# Patient Record
Sex: Female | Born: 1975 | Race: Black or African American | Hispanic: No | Marital: Single | Smoking: Never smoker
Health system: Southern US, Community
[De-identification: ages and names within clinical notes are randomized; demographics above are authoritative.]

## PROBLEM LIST (undated history)

## (undated) DIAGNOSIS — L680 Hirsutism: Secondary | ICD-10-CM

## (undated) HISTORY — DX: Hirsutism: L68.0

---

## 1997-07-31 ENCOUNTER — Emergency Department (HOSPITAL_COMMUNITY): Admission: EM | Admit: 1997-07-31 | Discharge: 1997-07-31 | Payer: Self-pay | Admitting: Emergency Medicine

## 1998-12-23 ENCOUNTER — Emergency Department (HOSPITAL_COMMUNITY): Admission: EM | Admit: 1998-12-23 | Discharge: 1998-12-23 | Payer: Self-pay | Admitting: Emergency Medicine

## 2000-04-24 HISTORY — PX: TUBAL LIGATION: SHX77

## 2000-05-18 ENCOUNTER — Ambulatory Visit (HOSPITAL_COMMUNITY): Admission: RE | Admit: 2000-05-18 | Discharge: 2000-05-18 | Payer: Self-pay | Admitting: Neurology

## 2000-05-18 ENCOUNTER — Encounter: Admission: RE | Admit: 2000-05-18 | Discharge: 2000-05-18 | Payer: Self-pay | Admitting: Family Medicine

## 2000-05-28 ENCOUNTER — Encounter: Admission: RE | Admit: 2000-05-28 | Discharge: 2000-05-28 | Payer: Self-pay | Admitting: Family Medicine

## 2000-06-11 ENCOUNTER — Encounter: Admission: RE | Admit: 2000-06-11 | Discharge: 2000-06-11 | Payer: Self-pay | Admitting: Family Medicine

## 2000-07-12 ENCOUNTER — Encounter: Admission: RE | Admit: 2000-07-12 | Discharge: 2000-07-12 | Payer: Self-pay | Admitting: Family Medicine

## 2000-08-13 ENCOUNTER — Encounter: Admission: RE | Admit: 2000-08-13 | Discharge: 2000-08-13 | Payer: Self-pay | Admitting: Family Medicine

## 2000-08-13 ENCOUNTER — Ambulatory Visit (HOSPITAL_COMMUNITY): Admission: RE | Admit: 2000-08-13 | Discharge: 2000-08-13 | Payer: Self-pay | Admitting: Family Medicine

## 2000-09-12 ENCOUNTER — Encounter: Admission: RE | Admit: 2000-09-12 | Discharge: 2000-09-12 | Payer: Self-pay | Admitting: Family Medicine

## 2000-10-19 ENCOUNTER — Encounter: Admission: RE | Admit: 2000-10-19 | Discharge: 2000-10-19 | Payer: Self-pay | Admitting: Family Medicine

## 2000-11-01 ENCOUNTER — Encounter: Admission: RE | Admit: 2000-11-01 | Discharge: 2000-11-01 | Payer: Self-pay | Admitting: Family Medicine

## 2000-11-15 ENCOUNTER — Encounter: Admission: RE | Admit: 2000-11-15 | Discharge: 2000-11-15 | Payer: Self-pay | Admitting: Family Medicine

## 2000-11-29 ENCOUNTER — Encounter: Admission: RE | Admit: 2000-11-29 | Discharge: 2000-11-29 | Payer: Self-pay | Admitting: Family Medicine

## 2000-12-13 ENCOUNTER — Encounter: Admission: RE | Admit: 2000-12-13 | Discharge: 2000-12-13 | Payer: Self-pay | Admitting: Family Medicine

## 2000-12-20 ENCOUNTER — Encounter: Admission: RE | Admit: 2000-12-20 | Discharge: 2000-12-20 | Payer: Self-pay | Admitting: Sports Medicine

## 2000-12-28 ENCOUNTER — Encounter: Admission: RE | Admit: 2000-12-28 | Discharge: 2000-12-28 | Payer: Self-pay | Admitting: Family Medicine

## 2001-01-02 ENCOUNTER — Encounter (INDEPENDENT_AMBULATORY_CARE_PROVIDER_SITE_OTHER): Payer: Self-pay | Admitting: *Deleted

## 2001-01-02 ENCOUNTER — Inpatient Hospital Stay (HOSPITAL_COMMUNITY): Admission: AD | Admit: 2001-01-02 | Discharge: 2001-01-04 | Payer: Self-pay | Admitting: *Deleted

## 2001-02-19 ENCOUNTER — Encounter: Admission: RE | Admit: 2001-02-19 | Discharge: 2001-02-19 | Payer: Self-pay | Admitting: Family Medicine

## 2001-08-23 ENCOUNTER — Encounter: Admission: RE | Admit: 2001-08-23 | Discharge: 2001-08-23 | Payer: Self-pay | Admitting: Family Medicine

## 2001-08-24 ENCOUNTER — Encounter (INDEPENDENT_AMBULATORY_CARE_PROVIDER_SITE_OTHER): Payer: Self-pay | Admitting: *Deleted

## 2001-08-24 LAB — CONVERTED CEMR LAB

## 2001-08-28 ENCOUNTER — Encounter: Admission: RE | Admit: 2001-08-28 | Discharge: 2001-08-28 | Payer: Self-pay | Admitting: Family Medicine

## 2002-01-12 ENCOUNTER — Emergency Department (HOSPITAL_COMMUNITY): Admission: EM | Admit: 2002-01-12 | Discharge: 2002-01-13 | Payer: Self-pay | Admitting: Emergency Medicine

## 2002-01-12 ENCOUNTER — Encounter: Payer: Self-pay | Admitting: Emergency Medicine

## 2002-02-28 ENCOUNTER — Encounter: Admission: RE | Admit: 2002-02-28 | Discharge: 2002-02-28 | Payer: Self-pay | Admitting: Family Medicine

## 2003-11-03 ENCOUNTER — Emergency Department (HOSPITAL_COMMUNITY): Admission: EM | Admit: 2003-11-03 | Discharge: 2003-11-03 | Payer: Self-pay | Admitting: Family Medicine

## 2003-11-13 ENCOUNTER — Encounter: Admission: RE | Admit: 2003-11-13 | Discharge: 2003-11-13 | Payer: Self-pay | Admitting: Family Medicine

## 2004-09-26 ENCOUNTER — Emergency Department (HOSPITAL_COMMUNITY): Admission: EM | Admit: 2004-09-26 | Discharge: 2004-09-27 | Payer: Self-pay

## 2006-01-17 ENCOUNTER — Ambulatory Visit: Payer: Self-pay | Admitting: Family Medicine

## 2006-01-18 ENCOUNTER — Encounter: Admission: RE | Admit: 2006-01-18 | Discharge: 2006-01-18 | Payer: Self-pay | Admitting: Family Medicine

## 2006-06-06 ENCOUNTER — Ambulatory Visit: Payer: Self-pay | Admitting: Sports Medicine

## 2006-06-21 DIAGNOSIS — N926 Irregular menstruation, unspecified: Secondary | ICD-10-CM | POA: Insufficient documentation

## 2006-06-22 ENCOUNTER — Encounter (INDEPENDENT_AMBULATORY_CARE_PROVIDER_SITE_OTHER): Payer: Self-pay | Admitting: *Deleted

## 2008-03-26 ENCOUNTER — Ambulatory Visit: Payer: Self-pay | Admitting: Family Medicine

## 2008-03-26 DIAGNOSIS — S61209A Unspecified open wound of unspecified finger without damage to nail, initial encounter: Secondary | ICD-10-CM | POA: Insufficient documentation

## 2008-05-06 ENCOUNTER — Encounter: Payer: Self-pay | Admitting: Family Medicine

## 2008-08-24 ENCOUNTER — Encounter: Payer: Self-pay | Admitting: Family Medicine

## 2008-08-24 ENCOUNTER — Encounter (INDEPENDENT_AMBULATORY_CARE_PROVIDER_SITE_OTHER): Payer: Self-pay | Admitting: Family Medicine

## 2008-08-24 ENCOUNTER — Ambulatory Visit: Payer: Self-pay | Admitting: Family Medicine

## 2008-08-24 DIAGNOSIS — S139XXA Sprain of joints and ligaments of unspecified parts of neck, initial encounter: Secondary | ICD-10-CM

## 2009-09-27 ENCOUNTER — Emergency Department (HOSPITAL_COMMUNITY): Admission: EM | Admit: 2009-09-27 | Discharge: 2009-09-27 | Payer: Self-pay | Admitting: Emergency Medicine

## 2010-06-01 ENCOUNTER — Other Ambulatory Visit: Payer: Self-pay | Admitting: Family Medicine

## 2010-06-01 ENCOUNTER — Encounter: Payer: Self-pay | Admitting: Family Medicine

## 2010-06-01 ENCOUNTER — Ambulatory Visit (INDEPENDENT_AMBULATORY_CARE_PROVIDER_SITE_OTHER): Payer: 59 | Admitting: Family Medicine

## 2010-06-01 VITALS — BP 135/84 | HR 77 | Temp 98.7°F | Ht 67.5 in | Wt 196.7 lb

## 2010-06-01 DIAGNOSIS — Z0000 Encounter for general adult medical examination without abnormal findings: Secondary | ICD-10-CM

## 2010-06-01 DIAGNOSIS — Z124 Encounter for screening for malignant neoplasm of cervix: Secondary | ICD-10-CM | POA: Insufficient documentation

## 2010-06-01 DIAGNOSIS — N926 Irregular menstruation, unspecified: Secondary | ICD-10-CM

## 2010-06-01 DIAGNOSIS — Z202 Contact with and (suspected) exposure to infections with a predominantly sexual mode of transmission: Secondary | ICD-10-CM | POA: Insufficient documentation

## 2010-06-01 DIAGNOSIS — Z20828 Contact with and (suspected) exposure to other viral communicable diseases: Secondary | ICD-10-CM

## 2010-06-01 LAB — POCT WET PREP (WET MOUNT): Yeast Wet Prep HPF POC: NEGATIVE

## 2010-06-01 LAB — RPR

## 2010-06-01 NOTE — Patient Instructions (Addendum)
It was good to see you today. You need to start getting mammograms this year since your mom developed breat ca in her 40's.  We will call you with a date and time for your ultrasound and with your labs results.

## 2010-06-01 NOTE — Assessment & Plan Note (Signed)
Pt is concerned that she may have gotten an STI from her separated husband. Will check for STI's

## 2010-06-01 NOTE — Progress Notes (Signed)
  Subjective:    Patient ID: Renee Lutz, female    DOB: [birth year], 35 y.o.   MRN: [id number]  HPI Pt here for a PAP smear and breast exam.  Her mother developed Breast Ca in her "84's" and she is now aware that she needs to get mammograms. Pt is going through a divorce currently and thinks that her husband may have cheated on her. She wants to be tested for STI's before she starts dating again.   Pt has irregular menstration: She has a h/o irregular painful menstration that use to last 9 days until just recently where it has shortened to about 5 days. She has had a BTL and it not taking any birth control. She also complains of facial hair that started after the birth of her last child. She is considering laser hair removal. She is overweight.    Review of Systems  Constitutional: Negative.   HENT: Negative.   Eyes: Negative.   Genitourinary: Positive for vaginal bleeding and menstrual problem.  Psychiatric/Behavioral: Negative.        Objective:   Physical Exam  Constitutional: She appears well-developed and well-nourished.  HENT:  Head: Normocephalic and atraumatic.  Neck: Normal range of motion.  Pulmonary/Chest:       Normal breast exam bilaterally. No LAD.   Abdominal: Soft.  Genitourinary: Vagina normal and uterus normal. No vaginal discharge found.          Assessment & Plan:

## 2010-06-01 NOTE — Assessment & Plan Note (Signed)
Irregular menstration, hirsutism and overweight makes me think this patient may have PCOS. Will get an Ultrasound to confirm my suspicion. If + will start the patient on metformin.

## 2010-06-01 NOTE — Assessment & Plan Note (Signed)
Pt is concerned that she may have gotten an STI from her separated husband. Will check for STI's 

## 2010-06-01 NOTE — Assessment & Plan Note (Signed)
Pap smear done today. Breast exam done as well. Pt given info about setting up a screening exam. She has the phone number and will call.

## 2010-06-02 LAB — GC/CHLAMYDIA PROBE AMP, GENITAL
Chlamydia, DNA Probe: NEGATIVE
GC Probe Amp, Genital: NEGATIVE

## 2010-06-06 ENCOUNTER — Other Ambulatory Visit: Payer: Self-pay | Admitting: Family Medicine

## 2010-06-08 ENCOUNTER — Encounter: Payer: Self-pay | Admitting: Family Medicine

## 2010-06-08 ENCOUNTER — Other Ambulatory Visit: Payer: Self-pay | Admitting: Family Medicine

## 2010-06-08 DIAGNOSIS — B9689 Other specified bacterial agents as the cause of diseases classified elsewhere: Secondary | ICD-10-CM | POA: Insufficient documentation

## 2010-06-08 DIAGNOSIS — N760 Acute vaginitis: Secondary | ICD-10-CM

## 2010-06-08 MED ORDER — METRONIDAZOLE 500 MG PO TABS: 500.0000 mg | ORAL_TABLET | Freq: Two times a day (BID) | ORAL | Status: AC

## 2010-07-01 ENCOUNTER — Telehealth: Payer: Self-pay | Admitting: *Deleted

## 2010-07-01 NOTE — Telephone Encounter (Signed)
Attempted to call patient to inform that pap smear was normal. Message says that mailbox is full.

## 2010-09-09 NOTE — Op Note (Signed)
[facility]  Patient:    Renee Lutz, Renee Lutz Visit Number: [id number] MRN: [id number]          Service Type: OBS Location: 910A [id number] Attending Physician:  Michaelle Copas Dictated by:   Roseanna Rainbow, M.D. Proc. Date: 01/03/01 Admit Date:  01/02/2001                             Operative Report  PREOPERATIVE DIAGNOSES:       Multiparity, desire sterilization.  POSTOPERATIVE DIAGNOSES:      Multiparity, desire sterilization.  PROCEDURE:                    Postpartum tubal ligation, modified [place].  SURGEON:                      Roseanna Rainbow, M.D.  ANESTHESIA:                   General endotracheal.  COMPLICATIONS:                None.  ESTIMATED BLOOD LOSS:         Less than 20 cc.  FLUIDS:                       ______  INDICATIONS FOR PROCEDURE:    The patient is a 35 year old para 2 status post NSVD who desires permanent sterilization. The risks and benefits of the procedure were reviewed with the patient including but not limited to risk of failure 4 to 11/998 with an increased risk of an ectopic gestation if pregnancy occurs.  FINDINGS:                     Normal uterus, tubes and ovaries.  DESCRIPTION OF PROCEDURE:     The patient was taken to the operating room where general anesthesia was induced without difficulty. A small transverse infraumbilical skin incision was made with the scalpel. The incision was carried down through the underlying fascia. The parietoperitoneum was identified and entered. The peritoneum was noted to be free of any adhesions and the incision was extended with the Metzenbaum scissors. The patients left fallopian tube was then identified, brought to the incision and grasped with the Babcock clamp. The tube was then followed out to the fimbriated end. The Babcock clamp was used to grasp the tube approximately 4 cm from the cornual region. A 2-3 cm segment of tube was then doubly  ligated with three ties of plain gut and then tied. Excellent hemostasis was noted and the tube returned to the abdomen. The right fallopian tube was manipulated in a similar fashion. The peritoneum and fascia were closed in a single layer using #0 Vicryl. The skin was closed in a subcuticular fashion using 3-0 Vicryl. The patient tolerated the procedure well. Sponge, lap, and needle counts were correct x 2. The patient was taken to the PACU in stable condition.  PATHOLOGY:                    Segments of right and left fallopian tubes. Dictated by:   Roseanna Rainbow, M.D. Attending Physician:  Michaelle Copas DD:  01/03/01 TD:  01/03/01 Job: [id number]

## 2011-12-26 ENCOUNTER — Encounter (HOSPITAL_COMMUNITY): Payer: Self-pay | Admitting: *Deleted

## 2011-12-26 ENCOUNTER — Emergency Department (HOSPITAL_COMMUNITY)
Admission: EM | Admit: 2011-12-26 | Discharge: 2011-12-26 | Disposition: A | Discharge status: Home / Self Care | Payer: 59 | Attending: Emergency Medicine | Admitting: Emergency Medicine

## 2011-12-26 DIAGNOSIS — Z803 Family history of malignant neoplasm of breast: Secondary | ICD-10-CM | POA: Insufficient documentation

## 2011-12-26 DIAGNOSIS — Z8249 Family history of ischemic heart disease and other diseases of the circulatory system: Secondary | ICD-10-CM | POA: Insufficient documentation

## 2011-12-26 DIAGNOSIS — W268XXA Contact with other sharp object(s), not elsewhere classified, initial encounter: Secondary | ICD-10-CM | POA: Insufficient documentation

## 2011-12-26 DIAGNOSIS — Z8489 Family history of other specified conditions: Secondary | ICD-10-CM | POA: Insufficient documentation

## 2011-12-26 DIAGNOSIS — S91012A Laceration without foreign body, left ankle, initial encounter: Secondary | ICD-10-CM

## 2011-12-26 DIAGNOSIS — Z23 Encounter for immunization: Secondary | ICD-10-CM | POA: Insufficient documentation

## 2011-12-26 DIAGNOSIS — S81009A Unspecified open wound, unspecified knee, initial encounter: Secondary | ICD-10-CM | POA: Insufficient documentation

## 2011-12-26 DIAGNOSIS — Z833 Family history of diabetes mellitus: Secondary | ICD-10-CM | POA: Insufficient documentation

## 2011-12-26 MED ORDER — BACITRACIN ZINC 500 UNIT/GM EX OINT
TOPICAL_OINTMENT | Freq: Two times a day (BID) | CUTANEOUS | Status: DC
  Filled 2011-12-26: qty 15

## 2011-12-26 MED ORDER — CEPHALEXIN 500 MG PO CAPS: 500.0000 mg | ORAL_CAPSULE | Freq: Four times a day (QID) | ORAL | Status: AC

## 2011-12-26 MED ORDER — BACITRACIN 500 UNIT/GM EX OINT
1.0000 "application " | TOPICAL_OINTMENT | Freq: Once | CUTANEOUS | Status: AC
  Administered 2011-12-26: 1 via TOPICAL
  Filled 2011-12-26: qty 0.9

## 2011-12-26 MED ORDER — TETANUS-DIPHTH-ACELL PERTUSSIS 5-2.5-18.5 LF-MCG/0.5 IM SUSP
0.5000 mL | Freq: Once | INTRAMUSCULAR | Status: AC
  Administered 2011-12-26: 0.5 mL via INTRAMUSCULAR
  Filled 2011-12-26: qty 0.5

## 2011-12-26 NOTE — ED Provider Notes (Signed)
History    This chart was scribed for Laray Anger, DO, MD by Smitty Pluck. The patient was seen in room TR07C and the patient's care was started at 2:18PM.   CSN: [id number]  Arrival date & time 12/26/11  1241   First MD Initiated Contact with Patient 12/26/11 1418      Chief Complaint  Patient presents with  . Extremity Laceration     HPI Renee Lutz is a 36 y.o. female who presents to the Emergency Department complaining of sudden onset and persistence of constant left foot laceration onset at 7:00PM, 1 day ago. Pt reports that she cut her foot on a piece of glass that fell while she was packing. Pt has washed the wound with peroxide.  Denies focal motor weakness, no tingling/numbness in extremity.  Pt is unsure if tetanus is UTD. Pt is requesting a work note.     Past Medical History  Diagnosis Date  . Hirsutism     Past Surgical History  Procedure Date  . Tubal ligation 2002    Family History  Problem Relation Age of Onset  . Breast cancer Mother   . Diabetes Mother   . Hypertension Mother   . Fibroids Mother   . Hypertension Father   . Glaucoma Father     History  Substance Use Topics  . Smoking status: Never Smoker   . Smokeless tobacco: Not on file  . Alcohol Use: No    Review of Systems ROS: Statement: All systems negative except as marked or noted in the HPI; Constitutional: Negative for fever and chills. ; ; Eyes: Negative for eye pain, redness and discharge. ; ; ENMT: Negative for ear pain, hoarseness, nasal congestion, sinus pressure and sore throat. ; ; Cardiovascular: Negative for chest pain, palpitations, diaphoresis, dyspnea and peripheral edema. ; ; Respiratory: Negative for cough, wheezing and stridor. ; ; Gastrointestinal: Negative for nausea, vomiting, diarrhea, abdominal pain, blood in stool, hematemesis, jaundice and rectal bleeding. . ; ; Genitourinary: Negative for dysuria, flank pain and hematuria. ; ; Musculoskeletal: Negative for back  pain and neck pain. Negative for swelling and trauma.; ; Skin: +laceration. Negative for pruritus, rash, abrasions, blisters, bruising and skin lesion.; ; Neuro: Negative for headache, lightheadedness and neck stiffness. Negative for weakness, altered level of consciousness , altered mental status, extremity weakness, paresthesias, involuntary movement, seizure and syncope.       Allergies  Review of patient's allergies indicates no known allergies.  Home Medications  No current outpatient prescriptions on file.  BP 125/62  Pulse 77  Temp 98.6 F (37 C)  Resp 16  SpO2 99%  Physical Exam 1425: Physical examination:  Nursing notes reviewed; Vital signs and O2 SAT reviewed;  Constitutional: Well developed, Well nourished, Well hydrated, In no acute distress; Head:  Normocephalic, atraumatic; Eyes: EOMI, PERRL, No scleral icterus; ENMT: Mouth and pharynx normal, Mucous membranes moist; Neck: Supple, Full range of motion, No lymphadenopathy; Cardiovascular: Regular rate and rhythm, No murmur, rub, or gallop; Respiratory: Breath sounds clear & equal bilaterally, No rales, rhonchi, wheezes.  Speaking full sentences with ease, Normal respiratory effort/excursion; Chest: Nontender, Movement normal;; Extremities: Pulses normal, No deformity. No tenderness, No edema, No calf edema or asymmetry.; Neuro: AA&Ox3, Major CN grossly intact.  Speech clear. No gross focal motor or sensory deficits in extremities.; Skin: Color normal, Warm, Dry, +2cm hemostatic superficial vertical linear skin lac to left lateral posterior malleolus without drainage or ecchymosis, no surrounding erythema.  Wound explored with adequate  hemostasis through ROM, as well as in position at the time of injury.  No apparent gross retained foreign body, no significant involvement of deep structures such as bone/joint/tendon/or neurovascular involvement noted.   ED Course  Procedures    MDM  MDM Reviewed: nursing note and  vitals     1430:  Lac well over 19+ hours old, will not close.  Wound care provided.  Td updated.  Requests a note for work.  Dx d/w pt.  Questions answered.  Verb understanding, agreeable to d/c home with outpt f/u.      I personally performed the services described in this documentation, which was scribed in my presence. The recorded information has been reviewed and considered. Sircharles Holzheimer Allison Quarry, DO 12/28/11 1556

## 2011-12-26 NOTE — ED Notes (Signed)
Lac to lower lankle caused by glass done yesterday  At 7 pm ~ 2 inch lac bleeding controlled at this time some swelling noted

## 2013-08-17 ENCOUNTER — Encounter (HOSPITAL_COMMUNITY): Payer: Self-pay | Admitting: Emergency Medicine

## 2013-08-17 ENCOUNTER — Emergency Department (HOSPITAL_COMMUNITY): Payer: 59

## 2013-08-17 ENCOUNTER — Emergency Department (HOSPITAL_COMMUNITY)
Admission: EM | Admit: 2013-08-17 | Discharge: 2013-08-17 | Disposition: A | Discharge status: Home / Self Care | Payer: 59 | Attending: Emergency Medicine | Admitting: Emergency Medicine

## 2013-08-17 DIAGNOSIS — Z79899 Other long term (current) drug therapy: Secondary | ICD-10-CM | POA: Insufficient documentation

## 2013-08-17 DIAGNOSIS — R059 Cough, unspecified: Secondary | ICD-10-CM

## 2013-08-17 DIAGNOSIS — R05 Cough: Secondary | ICD-10-CM

## 2013-08-17 DIAGNOSIS — Z872 Personal history of diseases of the skin and subcutaneous tissue: Secondary | ICD-10-CM | POA: Insufficient documentation

## 2013-08-17 DIAGNOSIS — J069 Acute upper respiratory infection, unspecified: Secondary | ICD-10-CM | POA: Insufficient documentation

## 2013-08-17 MED ORDER — FLUTICASONE PROPIONATE 50 MCG/ACT NA SUSP
2.0000 | Freq: Every day | NASAL | Status: DC
  Administered 2013-08-17: 2 via NASAL
  Filled 2013-08-17: qty 16

## 2013-08-17 MED ORDER — BENZONATATE 200 MG PO CAPS: 200.0000 mg | ORAL_CAPSULE | Freq: Three times a day (TID) | ORAL | Status: DC | PRN

## 2013-08-17 MED ORDER — HYDROCOD POLST-CHLORPHEN POLST 10-8 MG/5ML PO LQCR: 5.0000 mL | Freq: Two times a day (BID) | ORAL | Status: DC | PRN

## 2013-08-17 MED ORDER — FLUTICASONE PROPIONATE 50 MCG/ACT NA SUSP: 2.0000 | Freq: Every day | NASAL | Status: DC

## 2013-08-17 MED ORDER — ALBUTEROL SULFATE HFA 108 (90 BASE) MCG/ACT IN AERS: 1.0000 | INHALATION_SPRAY | RESPIRATORY_TRACT | Status: DC | PRN

## 2013-08-17 MED ORDER — BENZONATATE 100 MG PO CAPS
200.0000 mg | ORAL_CAPSULE | Freq: Three times a day (TID) | ORAL | Status: DC | PRN
  Administered 2013-08-17: 200 mg via ORAL
  Filled 2013-08-17: qty 2

## 2013-08-17 MED ORDER — ALBUTEROL SULFATE HFA 108 (90 BASE) MCG/ACT IN AERS
1.0000 | INHALATION_SPRAY | RESPIRATORY_TRACT | Status: DC | PRN
  Administered 2013-08-17: 2 via RESPIRATORY_TRACT
  Filled 2013-08-17: qty 6.7

## 2013-08-17 NOTE — ED Notes (Signed)
Pt reports nasal congestion, coughing up thick white mucus, and intermittent chills x 2 wks; sx unrelieved by Robitussin and Mussinex. Pt also c/o generalized body aches. Pt unsure if she has been running a fever.

## 2013-08-17 NOTE — ED Provider Notes (Signed)
CSN: [id number]     Arrival date & time 08/17/13  0022 History   First MD Initiated Contact with Patient 08/17/13 [phone]     Chief Complaint  Patient presents with  . Nasal Congestion  . Cough     (Consider location/radiation/quality/duration/timing/severity/associated sxs/prior Treatment) HPI 38 year old female presents to emergency room with complaint of persistent cough, nasal congestion for the past 2 weeks.  Patient reports about a week ago she was also having myalgias, chills and hot flashes.  She's been taken Robitussin, Mucinex, Excedrin without improvement.  She also has been taking Claritin taking symptoms may be associated with allergy.  This is also not helped.  Patient is nonsmoker.  No past medical history.  No sick contacts.  Patient is concerned that she has walking pneumonia.  Patient has subjective fever about 5 days ago none since.  No nausea vomiting diarrhea.  No history of pneumonia, bronchitis. Past Medical History  Diagnosis Date  . Hirsutism    Past Surgical History  Procedure Laterality Date  . Tubal ligation  2002   Family History  Problem Relation Age of Onset  . Breast cancer Mother   . Diabetes Mother   . Hypertension Mother   . Fibroids Mother   . Hypertension Father   . Glaucoma Father    History  Substance Use Topics  . Smoking status: Never Smoker   . Smokeless tobacco: Not on file  . Alcohol Use: No   OB History   Grav Para Term Preterm Abortions TAB SAB Ect Mult Living                 Review of Systems   See History of Present Illness; otherwise all other systems are reviewed and negative  Allergies  Review of patient's allergies indicates no known allergies.  Home Medications   Prior to Admission medications   Medication Sig Start Date End Date Taking? Authorizing Provider  guaiFENesin (MUCINEX) 600 MG 12 hr tablet Take 600 mg by mouth 2 (two) times daily.   Yes Historical Provider, MD  guaifenesin (ROBITUSSIN) 100 MG/5ML syrup  Take 100 mg by mouth 3 (three) times daily as needed for cough.   Yes Historical Provider, MD   BP 125/105  Pulse 68  Temp(Src) 98.6 F (37 C) (Oral)  Resp 19  SpO2 96%  LMP 07/19/2013 Physical Exam  Nursing note and vitals reviewed. Constitutional: She is oriented to person, place, and time. She appears well-developed and well-nourished.  HENT:  Head: Normocephalic and atraumatic.  Right Ear: External ear normal.  Left Ear: External ear normal.  Nose: Nose normal.  Mouth/Throat: Oropharynx is clear and moist.  Eyes: Conjunctivae and EOM are normal. Pupils are equal, round, and reactive to light.  Neck: Normal range of motion. Neck supple. No JVD present. No tracheal deviation present. No thyromegaly present.  Cardiovascular: Normal rate, regular rhythm, normal heart sounds and intact distal pulses.  Exam reveals no gallop and no friction rub.   No murmur heard. Pulmonary/Chest: Effort normal and breath sounds normal. No stridor. No respiratory distress. She has no wheezes. She has no rales. She exhibits no tenderness.  Dry cough noted  Abdominal: Soft. Bowel sounds are normal. She exhibits no distension and no mass. There is no tenderness. There is no rebound and no guarding.  Musculoskeletal: Normal range of motion. She exhibits no edema and no tenderness.  Lymphadenopathy:    She has no cervical adenopathy.  Neurological: She is oriented to person, place, and time.  She has normal reflexes. No cranial nerve deficit. She exhibits normal muscle tone. Coordination normal.  Skin: Skin is warm and dry. No rash noted. No erythema. No pallor.  Psychiatric: She has a normal mood and affect. Her behavior is normal. Judgment and thought content normal.    ED Course  Procedures (including critical care time) Labs Review Labs Reviewed - No data to display  Imaging Review Dg Chest 2 View  08/17/2013   CLINICAL DATA:  Shortness of breath, chest pain, pain and tingling down both arms for 2  weeks  EXAM: CHEST  2 VIEW  COMPARISON:  01/18/2006  FINDINGS: Normal heart size, mediastinal contours, and pulmonary vascularity.  Lungs clear.  No pleural effusion or pneumothorax.  Question mild pectus excavatum.  IMPRESSION: No acute abnormalities.   Electronically Signed   By: Ulyses SouthwardMark  Boles M.D.   On: 08/17/2013 01:21     EKG Interpretation None      MDM   Final diagnoses:  Cough  URI (upper respiratory infection)    38 year old female with nasal congestion cough ongoing for 2 weeks.  We'll check chest x-ray given patient's persistent symptoms.  Will treat with albuterol inhaler, Tessalon, Flonase.    Olivia Mackielga M Meral Geissinger, MD 08/17/13 [phone]

## 2013-08-17 NOTE — Discharge Instructions (Signed)
Cough, Adult  A cough is a reflex that helps clear your throat and airways. It can help heal the body or may be a reaction to an irritated airway. A cough may only last 2 or 3 weeks (acute) or may last more than 8 weeks (chronic).  CAUSES Acute cough:  Viral or bacterial infections. Chronic cough:  Infections.  Allergies.  Asthma.  Post-nasal drip.  Smoking.  Heartburn or acid reflux.  Some medicines.  Chronic lung problems (COPD).  Cancer. SYMPTOMS   Cough.  Fever.  Chest pain.  Increased breathing rate.  High-pitched whistling sound when breathing (wheezing).  Colored mucus that you cough up (sputum). TREATMENT   A bacterial cough may be treated with antibiotic medicine.  A viral cough must run its course and will not respond to antibiotics.  Your caregiver may recommend other treatments if you have a chronic cough. HOME CARE INSTRUCTIONS   Only take over-the-counter or prescription medicines for pain, discomfort, or fever as directed by your caregiver. Use cough suppressants only as directed by your caregiver.  Use a cold steam vaporizer or humidifier in your bedroom or home to help loosen secretions.  Sleep in a semi-upright position if your cough is worse at night.  Rest as needed.  Stop smoking if you smoke. SEEK IMMEDIATE MEDICAL CARE IF:   You have pus in your sputum.  Your cough starts to worsen.  You cannot control your cough with suppressants and are losing sleep.  You begin coughing up blood.  You have difficulty breathing.  You develop pain which is getting worse or is uncontrolled with medicine.  You have a fever. MAKE SURE YOU:   Understand these instructions.  Will watch your condition.  Will get help right away if you are not doing well or get worse. Document Released: 10/07/2010 Document Revised: 07/03/2011 Document Reviewed: 10/07/2010 [facility] Patient Information 2014 [place].  Upper Respiratory Infection,  Adult An upper respiratory infection (URI) is also sometimes known as the common cold. The upper respiratory tract includes the nose, sinuses, throat, trachea, and bronchi. Bronchi are the airways leading to the lungs. Most people improve within 1 week, but symptoms can last up to 2 weeks. A residual cough may last even longer.  CAUSES Many different viruses can infect the tissues lining the upper respiratory tract. The tissues become irritated and inflamed and often become very moist. Mucus production is also common. A cold is contagious. You can easily spread the virus to others by oral contact. This includes kissing, sharing a glass, coughing, or sneezing. Touching your mouth or nose and then touching a surface, which is then touched by another person, can also spread the virus. SYMPTOMS  Symptoms typically develop 1 to 3 days after you come in contact with a cold virus. Symptoms vary from person to person. They may include:  Runny nose.  Sneezing.  Nasal congestion.  Sinus irritation.  Sore throat.  Loss of voice (laryngitis).  Cough.  Fatigue.  Muscle aches.  Loss of appetite.  Headache.  Low-grade fever. DIAGNOSIS  You might diagnose your own cold based on familiar symptoms, since most people get a cold 2 to 3 times a year. Your caregiver can confirm this based on your exam. Most importantly, your caregiver can check that your symptoms are not due to another disease such as strep throat, sinusitis, pneumonia, asthma, or epiglottitis. Blood tests, throat tests, and X-rays are not necessary to diagnose a common cold, but they may sometimes be helpful  in excluding other more serious diseases. Your caregiver will decide if any further tests are required. RISKS AND COMPLICATIONS  You may be at risk for a more severe case of the common cold if you smoke cigarettes, have chronic heart disease (such as heart failure) or lung disease (such as asthma), or if you have a weakened immune  system. The very young and very old are also at risk for more serious infections. Bacterial sinusitis, middle ear infections, and bacterial pneumonia can complicate the common cold. The common cold can worsen asthma and chronic obstructive pulmonary disease (COPD). Sometimes, these complications can require emergency medical care and may be life-threatening. PREVENTION  The best way to protect against getting a cold is to practice good hygiene. Avoid oral or hand contact with people with cold symptoms. Wash your hands often if contact occurs. There is no clear evidence that vitamin C, vitamin E, echinacea, or exercise reduces the chance of developing a cold. However, it is always recommended to get plenty of rest and practice good nutrition. TREATMENT  Treatment is directed at relieving symptoms. There is no cure. Antibiotics are not effective, because the infection is caused by a virus, not by bacteria. Treatment may include:  Increased fluid intake. Sports drinks offer valuable electrolytes, sugars, and fluids.  Breathing heated mist or steam (vaporizer or shower).  Eating chicken soup or other clear broths, and maintaining good nutrition.  Getting plenty of rest.  Using gargles or lozenges for comfort.  Controlling fevers with ibuprofen or acetaminophen as directed by your caregiver.  Increasing usage of your inhaler if you have asthma. Zinc gel and zinc lozenges, taken in the first 24 hours of the common cold, can shorten the duration and lessen the severity of symptoms. Pain medicines may help with fever, muscle aches, and throat pain. A variety of non-prescription medicines are available to treat congestion and runny nose. Your caregiver can make recommendations and may suggest nasal or lung inhalers for other symptoms.  HOME CARE INSTRUCTIONS   Only take over-the-counter or prescription medicines for pain, discomfort, or fever as directed by your caregiver.  Use a warm mist humidifier  or inhale steam from a shower to increase air moisture. This may keep secretions moist and make it easier to breathe.  Drink enough water and fluids to keep your urine clear or pale yellow.  Rest as needed.  Return to work when your temperature has returned to normal or as your caregiver advises. You may need to stay home longer to avoid infecting others. You can also use a face mask and careful hand washing to prevent spread of the virus. SEEK MEDICAL CARE IF:   After the first few days, you feel you are getting worse rather than better.  You need your caregiver's advice about medicines to control symptoms.  You develop chills, worsening shortness of breath, or brown or red sputum. These may be signs of pneumonia.  You develop yellow or brown nasal discharge or pain in the face, especially when you bend forward. These may be signs of sinusitis.  You develop a fever, swollen neck glands, pain with swallowing, or white areas in the back of your throat. These may be signs of strep throat. SEEK IMMEDIATE MEDICAL CARE IF:   You have a fever.  You develop severe or persistent headache, ear pain, sinus pain, or chest pain.  You develop wheezing, a prolonged cough, cough up blood, or have a change in your usual mucus (if you have chronic  lung disease).  You develop sore muscles or a stiff neck. Document Released: 10/04/2000 Document Revised: 07/03/2011 Document Reviewed: 08/12/2010 [facility] Patient Information 2014 [place], [place].  Viral Infections A virus is a type of germ. Viruses can cause:  Minor sore throats.  Aches and pains.  Headaches.  Runny nose.  Rashes.  Watery eyes.  Tiredness.  Coughs.  Loss of appetite.  Feeling sick to your stomach (nausea).  Throwing up (vomiting).  Watery poop (diarrhea). HOME CARE   Only take medicines as told by your doctor.  Drink enough water and fluids to keep your pee (urine) clear or pale yellow. Sports drinks are a good  choice.  Get plenty of rest and eat healthy. Soups and broths with crackers or rice are fine. GET HELP RIGHT AWAY IF:   You have a very bad headache.  You have shortness of breath.  You have chest pain or neck pain.  You have an unusual rash.  You cannot stop throwing up.  You have watery poop that does not stop.  You cannot keep fluids down.  You or your child has a temperature by mouth above 102 F (38.9 C), not controlled by medicine.  Your baby is older than 3 months with a rectal temperature of 102 F (38.9 C) or higher.  Your baby is 353 months old or younger with a rectal temperature of 100.4 F (38 C) or higher. MAKE SURE YOU:   Understand these instructions.  Will watch this condition.  Will get help right away if you are not doing well or get worse. Document Released: 03/23/2008 Document Revised: 07/03/2011 Document Reviewed: 08/16/2010 [facility] Patient Information 2014 [place], [place].

## 2013-10-31 ENCOUNTER — Ambulatory Visit (INDEPENDENT_AMBULATORY_CARE_PROVIDER_SITE_OTHER): Payer: 59 | Admitting: Family Medicine

## 2013-10-31 ENCOUNTER — Encounter: Payer: Self-pay | Admitting: Family Medicine

## 2013-10-31 ENCOUNTER — Other Ambulatory Visit (HOSPITAL_COMMUNITY)
Admission: RE | Admit: 2013-10-31 | Discharge: 2013-10-31 | Disposition: A | Discharge status: Home / Self Care | Payer: 59 | Source: Ambulatory Visit | Attending: Family Medicine | Admitting: Family Medicine

## 2013-10-31 VITALS — BP 117/82 | HR 111 | Temp 99.8°F | Ht 67.5 in | Wt 205.0 lb

## 2013-10-31 DIAGNOSIS — J029 Acute pharyngitis, unspecified: Secondary | ICD-10-CM

## 2013-10-31 DIAGNOSIS — Z124 Encounter for screening for malignant neoplasm of cervix: Secondary | ICD-10-CM | POA: Insufficient documentation

## 2013-10-31 DIAGNOSIS — Z0000 Encounter for general adult medical examination without abnormal findings: Secondary | ICD-10-CM

## 2013-10-31 DIAGNOSIS — IMO0001 Reserved for inherently not codable concepts without codable children: Secondary | ICD-10-CM

## 2013-10-31 DIAGNOSIS — Z1151 Encounter for screening for human papillomavirus (HPV): Secondary | ICD-10-CM | POA: Insufficient documentation

## 2013-10-31 DIAGNOSIS — R87810 Cervical high risk human papillomavirus (HPV) DNA test positive: Secondary | ICD-10-CM | POA: Insufficient documentation

## 2013-10-31 LAB — LIPID PANEL
CHOL/HDL RATIO: 3.3 ratio
CHOLESTEROL: 128 mg/dL (ref 0–200)
HDL: 39 mg/dL — AB (ref >39)
LDL Cholesterol: 68 mg/dL (ref 0–99)
Triglycerides: 104 mg/dL (ref <150)
VLDL: 21 mg/dL (ref 0–40)

## 2013-10-31 LAB — COMPREHENSIVE METABOLIC PANEL
ALBUMIN: 3.9 g/dL (ref 3.5–5.2)
ALT: 15 U/L (ref 0–35)
AST: 19 U/L (ref 0–37)
Alkaline Phosphatase: 70 U/L (ref 39–117)
BUN: 8 mg/dL (ref 6–23)
CO2: 24 mEq/L (ref 19–32)
CREATININE: 0.76 mg/dL (ref 0.50–1.10)
Calcium: 8.6 mg/dL (ref 8.4–10.5)
Chloride: 101 mEq/L (ref 96–112)
Glucose, Bld: 95 mg/dL (ref 70–99)
Potassium: 3.5 mEq/L (ref 3.5–5.3)
Sodium: 136 mEq/L (ref 135–145)
Total Bilirubin: 0.7 mg/dL (ref 0.2–1.2)
Total Protein: 7 g/dL (ref 6.0–8.3)

## 2013-10-31 LAB — POCT RAPID STREP A (OFFICE): Rapid Strep A Screen: NEGATIVE

## 2013-10-31 MED ORDER — IBUPROFEN 600 MG PO TABS: 600.0000 mg | ORAL_TABLET | Freq: Three times a day (TID) | ORAL | Status: DC | PRN

## 2013-10-31 NOTE — Assessment & Plan Note (Signed)
PAP done today. CMET and Lipid profile checked. HIV test discussed. I suggested we get release of information signed for us to obtain copy of her HIV result from the health department but she declined. I explained to her we might need to know if we need to get another HIV testing done for her even though she was informed that the rapid test is negative. Patient does not seem interested at this time, she will follow up with the health department in 3 wks for her confirmatory test.

## 2013-10-31 NOTE — Patient Instructions (Signed)
It was nice seeing you today Renee Lutz, I am sorry you got exposed to HIV but the good thing is you test is negative so far. Please help us obtain record from the health department of your HIV result. We might need to get you tested again in the next few months, but I will let you know this once I get the report from the help department. Please call if you have any question.   Sexually Transmitted Disease A sexually transmitted disease (STD) is a disease or infection often passed to another person during sex. However, STDs can be passed through nonsexual ways. An STD can be passed through:  Spit (saliva).  Semen.  Blood.  Mucus from the vagina.  Pee (urine). HOW CAN I LESSEN MY CHANCES OF GETTING AN STD?  Use:  Latex condoms.  Water-soluble lubricants with condoms. Do not use petroleum jelly or oils.  Dental dams. These are small pieces of latex that are used as a barrier during oral sex.  Avoid having more than one sex partner.  Do not have sex with someone who has other sex partners.  Do not have sex with anyone you do not know or who is at high risk for an STD.  Avoid risky sex that can break your skin.  Do not have sex if you have open sores on your mouth or skin.  Avoid drinking too much alcohol or taking illegal drugs. Alcohol and drugs can affect your good judgment.  Avoid oral and anal sex acts.  Get shots (vaccines) for HPV and hepatitis.  If you are at risk of being infected with HIV, it is advised that you take a certain medicine daily to prevent HIV infection. This is called pre-exposure prophylaxis (PrEP). You may be at risk if:  You are a man who has sex with other men (MSM).  You are attracted to the opposite sex (heterosexual) and are having sex with more than one partner.  You take drugs with a needle.  You have sex with someone who has HIV.  Talk with your doctor about if you are at high risk of being infected with HIV. If you begin to take PrEP, get  tested for HIV first. Get tested every 3 months for as long as you are taking PrEP. WHAT SHOULD I DO IF I THINK I HAVE AN STD?  See your doctor.  Tell your sex partner(s) that you have an STD. They should be tested and treated.  Do not have sex until your doctor says it is okay. WHEN SHOULD I GET HELP? Get help right away if:  You have bad belly (abdominal) pain.  You are a man and have puffiness (swelling) or pain in your testicles.  You are a woman and have puffiness in your vagina. Document Released: 05/18/2004 Document Revised: 04/15/2013 Document Reviewed: 10/04/2012 [facility] Patient Information 2015 [employer] [place]. This information is not intended to replace advice given to you by your health care provider. Make sure you discuss any questions you have with your health care provider.

## 2013-10-31 NOTE — Assessment & Plan Note (Signed)
Rapid strep negative. Her risk for strep infection low. Whitish particle on both tonsil likely food particles. Ibuprofen prescribed prn pain. Return precaution was given.

## 2013-10-31 NOTE — Progress Notes (Signed)
Patient ID: Renee Lutz, female   DOB: 1975-08-22, 38 y.o.   MRN: [id number] Subjective:     Renee Lutz is a 38 y.o. female and is here for a comprehensive physical exam. The patient reports problems - Sorethroat. This has been on going for 3 days, fever on and off, she will like to get check. Was exposed to HIV partner 6 months ago.  Not currently sexually active. Has 2 pregnancies in the past with one miscarriage.LMP: October 09 2013  History   Social History  . Marital Status: Single    Spouse Name: N/A    Number of Children: N/A  . Years of Education: N/A   Occupational History  . Not on file.   Social History Main Topics  . Smoking status: Never Smoker   . Smokeless tobacco: Not on file  . Alcohol Use: No  . Drug Use: No  . Sexual Activity: Not Currently    Birth Control/ Protection: Surgical     Comment: Plans to start dating again   Other Topics Concern  . Not on file   Social History Narrative  . No narrative on file   Health Maintenance  Topic Date Due  . Pap Smear  06/01/2013  . Influenza Vaccine  11/22/2013  . Tetanus/tdap  12/25/2021    The following portions of the patient's history were reviewed and updated as appropriate: allergies, current medications, past family history, past medical history, past social history, past surgical history and problem list.  Review of Systems Pertinent items are noted in HPI.   Objective:    BP 117/82  Pulse 111  Temp(Src) 99.8 F (37.7 C) (Oral)  Ht 5' 7.5" (1.715 m)  Wt 205 lb (92.987 kg)  BMI 31.62 kg/m2  LMP 10/09/2013 General appearance: alert and cooperative Head: Normocephalic, without obvious abnormality, atraumatic Eyes: conjunctivae/corneas clear. PERRL, EOM's intact. Fundi benign. Ears: normal TM's and external ear canals both ears Throat: lips, mucosa, and tongue normal; teeth and gums normal and mild erythema of her oropharynx with whitish particle or discharge on her tonsils ( tonsilolith vs  exudate) Neck: no adenopathy, no carotid bruit, no JVD, supple, symmetrical, trachea midline and thyroid not enlarged, symmetric, no tenderness/mass/nodules Lungs: clear to auscultation bilaterally Heart: regular rate and rhythm, S1, S2 normal, no murmur, click, rub or gallop Abdomen: soft, non-tender; bowel sounds normal; no masses,  no organomegaly Pelvic: cervix normal in appearance, external genitalia normal, no adnexal masses or tenderness, no cervical motion tenderness, rectovaginal septum normal, uterus normal size, shape, and consistency and vagina normal without discharge Extremities: extremities normal, atraumatic, no cyanosis or edema Skin: Skin color, texture, turgor normal. No rashes or lesions Lymph nodes: Cervical, supraclavicular, and axillary nodes normal. Neurologic: Grossly normal    Assessment:    Healthy female exam. PAP done Pharyngitis      Plan:     See After Visit Summary for Counseling Recommendations

## 2013-11-03 ENCOUNTER — Telehealth: Payer: Self-pay | Admitting: Family Medicine

## 2013-11-03 NOTE — Telephone Encounter (Signed)
Message left for patient to call about test result. BMet is normal. Cholesterol test looks good as well.  Routine follow up as needed.

## 2013-11-03 NOTE — Telephone Encounter (Signed)
Patient informed. 

## 2013-11-04 LAB — CYTOLOGY - PAP

## 2013-11-06 ENCOUNTER — Telehealth: Payer: Self-pay | Admitting: Family Medicine

## 2013-11-06 NOTE — Telephone Encounter (Signed)
I called and spoke with patient about her abnormal PAP (ASCUS and +HPV). [facility] Patient verbalized understanding and agreed with plan.

## 2013-11-06 NOTE — Telephone Encounter (Signed)
LM for patient to call back and schedule an appt for colposcopy.  Please assist in scheduling this at patient's convenience. Phoua Hoadley,CMA

## 2013-11-07 ENCOUNTER — Telehealth: Payer: Self-pay | Admitting: Family Medicine

## 2013-11-07 NOTE — Telephone Encounter (Signed)
I discussed need to reschedule Gyn clinic appointment for colposcopy, she verbalized understanding and agreed to reschedule.

## 2013-11-07 NOTE — Telephone Encounter (Signed)
Message left for patient to reschedule appointment for colposcopy. She has appointment to see me on Friday instead.

## 2013-11-10 NOTE — Telephone Encounter (Signed)
LMOVM for pt to return call to reschedule (see below message).  [facility]  Please reschedule appt. Renee Lutz, Renee RochesterJessica Lutz

## 2013-11-14 ENCOUNTER — Ambulatory Visit: Payer: 59 | Admitting: Family Medicine

## 2013-11-20 ENCOUNTER — Ambulatory Visit (INDEPENDENT_AMBULATORY_CARE_PROVIDER_SITE_OTHER): Payer: 59 | Admitting: Family Medicine

## 2013-11-20 VITALS — BP 134/86 | HR 91 | Temp 99.3°F | Wt 203.2 lb

## 2013-11-20 DIAGNOSIS — R87610 Atypical squamous cells of undetermined significance on cytologic smear of cervix (ASC-US): Secondary | ICD-10-CM

## 2013-11-20 DIAGNOSIS — R87611 Atypical squamous cells cannot exclude high grade squamous intraepithelial lesion on cytologic smear of cervix (ASC-H): Secondary | ICD-10-CM

## 2013-11-20 NOTE — Patient Instructions (Signed)
Colposcopy Care After Colposcopy is a procedure in which a special tool is used to magnify the surface of the cervix. A tissue sample (biopsy) may also be taken. This sample will be looked at for cervical cancer or other problems. After the test:  You may have some cramping.  Lie down for a few minutes if you feel lightheaded.   You may have some bleeding which should stop in a few days. HOME CARE  Do not have sex or use tampons for 2 to 3 days or as told.  Only take medicine as told by your doctor.  Continue to take your birth control pills as usual. Finding out the results of your test Ask when your test results will be ready. Make sure you get your test results. GET HELP RIGHT AWAY IF:  You are bleeding a lot or are passing blood clots.  You develop a fever of 102 F (38.9 C) or higher.  You have abnormal vaginal discharge.  You have cramps that do not go away with medicine.  You feel lightheaded, dizzy, or pass out (faint). MAKE SURE YOU:   Understand these instructions.  Will watch your condition.  Will get help right away if you are not doing well or get worse. Document Released: 09/27/2007 Document Revised: 07/03/2011 Document Reviewed: 11/07/2012 [employer] Patient Information 2015 [employer]. This information is not intended to replace advice given to you by your health care provider. Make sure you discuss any questions you have with your health care provider.  

## 2013-11-20 NOTE — Progress Notes (Signed)
Patient ID: Renee Lutz, female   DOB: 1976/03/26, 38 y.o.   MRN: [id number]  HPI Renee ShelterBetty Kurtzman is a 38 y.o. female here for colposcopy HPI  Indications: Pap smear on July 2015 showed: ASCUS with POSITIVE high risk HPV. Previous colposcopy: none. Prior cervical treatment: no treatment.  Past Medical History  Diagnosis Date  . Hirsutism    Past Surgical History  Procedure Laterality Date  . Tubal ligation  2002   Current Outpatient Prescriptions  Medication Sig Dispense Refill  . albuterol (PROVENTIL HFA;VENTOLIN HFA) 108 (90 BASE) MCG/ACT inhaler Inhale 1-2 puffs into the lungs every 4 (four) hours as needed for wheezing or shortness of breath (cough).  1 Inhaler  0  . ibuprofen (ADVIL,MOTRIN) 600 MG tablet Take 1 tablet (600 mg total) by mouth every 8 (eight) hours as needed.  30 tablet  0   No current facility-administered medications for this visit.    Review of Systems Review of Systems  Respiratory: Negative.   Cardiovascular: Negative.   Genitourinary: Negative.   All other systems reviewed and are negative.    Blood pressure 134/86, pulse 91, temperature 99.3 F (37.4 C), temperature source Oral, weight 203 lb 3.2 oz (92.171 kg), last menstrual period 10/09/2013.  Physical Exam Physical Exam  Nursing note and vitals reviewed. Constitutional: She appears well-developed. No distress.  Cardiovascular: Normal rate, regular rhythm and normal heart sounds.   No murmur heard. Pulmonary/Chest: Effort normal and breath sounds normal. No respiratory distress.  Genitourinary: Cervix exhibits no motion tenderness and no discharge.      Patient given informed consent, signed copy in the chart.  Placed in lithotomy position. Cervix viewed with speculum and colposcope after application of acetic acid.   Colposcopy adequate (entire squamocolumnar junctions seen  in entirety) ?  Yes Acetowhite lesions?Yes, 3-4 o'clock Punctation?No Mosaicism?  No Abnormal vasculature?   Yes Biopsies?Yes 4 o'clock ECC?Yes Complications? Minimal bleeding controlled with monsel's  COMMENTS: Patient was given post procedure instructions.  I will notify her of any pathology results.   Data Reviewed Pap smear  Assessment/Plan    Specimens labelled and sent to Pathology.      Janit PaganIOLA, Kaytie Ratcliffe 11/20/2013, 11:05 AM

## 2013-11-21 ENCOUNTER — Telehealth: Payer: Self-pay | Admitting: Family Medicine

## 2013-11-21 NOTE — Telephone Encounter (Signed)
I discussed colposcopy/biopsy report with patient. I recommended repeat co-testing in 1 yr. All questions were answered. Patient verbalized understanding and agreed with plan.

## 2014-09-22 ENCOUNTER — Encounter: Payer: Self-pay | Admitting: Family Medicine

## 2014-10-27 ENCOUNTER — Encounter: Payer: Self-pay | Admitting: Family Medicine

## 2015-08-23 ENCOUNTER — Ambulatory Visit (INDEPENDENT_AMBULATORY_CARE_PROVIDER_SITE_OTHER): Payer: BLUE CROSS/BLUE SHIELD | Admitting: Student

## 2015-08-23 ENCOUNTER — Emergency Department (HOSPITAL_COMMUNITY)
Admission: EM | Admit: 2015-08-23 | Discharge: 2015-08-23 | Disposition: A | Discharge status: Home / Self Care | Payer: BLUE CROSS/BLUE SHIELD | Attending: Emergency Medicine | Admitting: Emergency Medicine

## 2015-08-23 ENCOUNTER — Encounter: Payer: Self-pay | Admitting: Student

## 2015-08-23 ENCOUNTER — Encounter (HOSPITAL_COMMUNITY): Payer: Self-pay | Admitting: *Deleted

## 2015-08-23 VITALS — BP 140/90 | HR 88 | Temp 98.4°F | Wt 226.0 lb

## 2015-08-23 DIAGNOSIS — N938 Other specified abnormal uterine and vaginal bleeding: Secondary | ICD-10-CM

## 2015-08-23 DIAGNOSIS — G4730 Sleep apnea, unspecified: Secondary | ICD-10-CM

## 2015-08-23 DIAGNOSIS — M7989 Other specified soft tissue disorders: Secondary | ICD-10-CM | POA: Diagnosis not present

## 2015-08-23 DIAGNOSIS — Z3202 Encounter for pregnancy test, result negative: Secondary | ICD-10-CM | POA: Diagnosis not present

## 2015-08-23 DIAGNOSIS — Z79899 Other long term (current) drug therapy: Secondary | ICD-10-CM | POA: Diagnosis not present

## 2015-08-23 DIAGNOSIS — I10 Essential (primary) hypertension: Secondary | ICD-10-CM | POA: Diagnosis not present

## 2015-08-23 DIAGNOSIS — N920 Excessive and frequent menstruation with regular cycle: Secondary | ICD-10-CM | POA: Insufficient documentation

## 2015-08-23 DIAGNOSIS — B9689 Other specified bacterial agents as the cause of diseases classified elsewhere: Secondary | ICD-10-CM

## 2015-08-23 DIAGNOSIS — N939 Abnormal uterine and vaginal bleeding, unspecified: Secondary | ICD-10-CM | POA: Diagnosis present

## 2015-08-23 DIAGNOSIS — R079 Chest pain, unspecified: Secondary | ICD-10-CM | POA: Diagnosis not present

## 2015-08-23 DIAGNOSIS — R0789 Other chest pain: Secondary | ICD-10-CM

## 2015-08-23 DIAGNOSIS — N760 Acute vaginitis: Secondary | ICD-10-CM | POA: Diagnosis not present

## 2015-08-23 LAB — WET PREP, GENITAL
Sperm: NONE SEEN
TRICH WET PREP: NONE SEEN
YEAST WET PREP: NONE SEEN

## 2015-08-23 LAB — BASIC METABOLIC PANEL
Anion gap: 9 (ref 5–15)
BUN: 12 mg/dL (ref 6–20)
CALCIUM: 8.7 mg/dL — AB (ref 8.9–10.3)
CO2: 25 mmol/L (ref 22–32)
Chloride: 106 mmol/L (ref 101–111)
Creatinine, Ser: 0.81 mg/dL (ref 0.44–1.00)
Glucose, Bld: 117 mg/dL — ABNORMAL HIGH (ref 65–99)
POTASSIUM: 3.5 mmol/L (ref 3.5–5.1)
Sodium: 140 mmol/L (ref 135–145)

## 2015-08-23 LAB — CBC WITH DIFFERENTIAL/PLATELET
BASOS ABS: 0 10*3/uL (ref 0.0–0.1)
BASOS PCT: 0 %
Eosinophils Absolute: 0.1 10*3/uL (ref 0.0–0.7)
Eosinophils Relative: 1 %
HCT: 35.2 % — ABNORMAL LOW (ref 36.0–46.0)
Hemoglobin: 11.7 g/dL — ABNORMAL LOW (ref 12.0–15.0)
LYMPHS PCT: 37 %
Lymphs Abs: 2.6 10*3/uL (ref 0.7–4.0)
MCH: 30.7 pg (ref 26.0–34.0)
MCHC: 33.2 g/dL (ref 30.0–36.0)
MCV: 92.4 fL (ref 78.0–100.0)
MONO ABS: 0.4 10*3/uL (ref 0.1–1.0)
Monocytes Relative: 6 %
NEUTROS PCT: 56 %
Neutro Abs: 4 10*3/uL (ref 1.7–7.7)
Platelets: 288 10*3/uL (ref 150–400)
RBC: 3.81 MIL/uL — ABNORMAL LOW (ref 3.87–5.11)
RDW: 12.6 % (ref 11.5–15.5)
WBC: 7.1 10*3/uL (ref 4.0–10.5)

## 2015-08-23 LAB — POC URINE PREG, ED: PREG TEST UR: NEGATIVE

## 2015-08-23 LAB — GC/CHLAMYDIA PROBE AMP (~~LOC~~) NOT AT ARMC
Chlamydia: NEGATIVE
NEISSERIA GONORRHEA: NEGATIVE

## 2015-08-23 MED ORDER — METRONIDAZOLE 500 MG PO TABS: 500.0000 mg | ORAL_TABLET | Freq: Two times a day (BID) | ORAL | Status: DC

## 2015-08-23 MED ORDER — HYDROCHLOROTHIAZIDE 12.5 MG PO CAPS: 12.5000 mg | ORAL_CAPSULE | Freq: Every day | ORAL | Status: DC

## 2015-08-23 NOTE — Assessment & Plan Note (Addendum)
Elevated blood pressure to 150/93 then 140/90 on recheck. Random glucose on BMP 117. Cr 0.81 - no history of DM, will start HCTZ for blood pressure and to aid in diureses given swelling - blood pressure check in 1 week

## 2015-08-23 NOTE — Patient Instructions (Addendum)
Please follow up in 1 week for blood presure check You will be started on a blood pressure medication for high blood pressure If you start to feel lightheaded or dizzy after taking it, stop and call the office You will be scheduled for an echocardiogram and be seen by cardiology to assess your heart function Please DO NOT to heavy exercise or see your personal trainer until after cardiology has seen you If you develop chest pain, shortness of breath, go to the ED for evaluation If you have questions or concerns call the office at [phone]

## 2015-08-23 NOTE — Assessment & Plan Note (Signed)
Reported apnea with sleep, with AM headaches is concerning for OSA - Will obtain sleep study

## 2015-08-23 NOTE — ED Provider Notes (Signed)
CSN: [id number]     Arrival date & time 08/23/15  0201 History   First MD Initiated Contact with Patient 08/23/15 0732     Chief Complaint  Patient presents with  . Vaginal Bleeding     (Consider location/radiation/quality/duration/timing/severity/associated sxs/prior Treatment) HPI Comments: Patient presents today with a chief complaint of vaginal bleeding.  She reports that she has been bleeding daily for the past 3-4 weeks.  She is changing a pad or tampon every 3 hours.  She states that her menstrual cycle lasted two weeks last month, but is usually regular and only lasts 3-4 days.  She reports mild associated lower abdominal cramping.  She has not been taking any medications for the cramping.  She denies nausea, vomiting, fever, chills, dizziness, or any other symptoms.  She is not on oral contraceptives.  She denies history of Cancer, Fibroids, Endometriosis, Ovarian Cyst, or any other Gynecologic Disease.  No history of bleeding disorders.  She is not on any anticoagulants.  Past surgical history significant for Tubal Ligation.  Review of the chart shows that her last pap smear was July 2015, which showed atypical squamous cells.  A colposcopy was performed at that time, which patient reports was normal.  Patient is a 40 y.o. female presenting with vaginal bleeding. The history is provided by the patient.  Vaginal Bleeding   Past Medical History  Diagnosis Date  . Hirsutism    Past Surgical History  Procedure Laterality Date  . Tubal ligation  2002   Family History  Problem Relation Age of Onset  . Breast cancer Mother   . Diabetes Mother   . Hypertension Mother   . Fibroids Mother   . Hypertension Father   . Glaucoma Father    Social History  Substance Use Topics  . Smoking status: Never Smoker   . Smokeless tobacco: Never Used  . Alcohol Use: Yes     Comment: ocassionally   OB History    No data available     Review of Systems  Genitourinary: Positive for vaginal  bleeding.  All other systems reviewed and are negative.     Allergies  Review of patient's allergies indicates no known allergies.  Home Medications   Prior to Admission medications   Medication Sig Start Date End Date Taking? Authorizing Provider  ibuprofen (ADVIL,MOTRIN) 600 MG tablet Take 1 tablet (600 mg total) by mouth every 8 (eight) hours as needed. 10/31/13  Yes Doreene ElandKehinde T Eniola, MD  albuterol (PROVENTIL HFA;VENTOLIN HFA) 108 (90 BASE) MCG/ACT inhaler Inhale 1-2 puffs into the lungs every 4 (four) hours as needed for wheezing or shortness of breath (cough). 08/17/13   Marisa Severinlga Otter, MD   BP 142/97 mmHg  Pulse 80  Temp(Src) 98.5 F (36.9 C) (Oral)  Resp 16  Wt 102.83 kg  SpO2 99%  LMP 08/09/2015 Physical Exam  Constitutional: She appears well-developed and well-nourished.  HENT:  Head: Normocephalic and atraumatic.  Mouth/Throat: Oropharynx is clear and moist.  Neck: Normal range of motion. Neck supple.  Cardiovascular: Normal rate, regular rhythm and normal heart sounds.   Pulmonary/Chest: Effort normal and breath sounds normal.  Abdominal: Soft. Bowel sounds are normal. She exhibits no distension and no mass. There is no tenderness. There is no rebound and no guarding.  Genitourinary: Cervix exhibits no motion tenderness. Right adnexum displays no mass, no tenderness and no fullness. Left adnexum displays no mass, no tenderness and no fullness.  Small amount of dark red blood in the vaginal vault.  No blood clots visualized.  Cervical os closed.  Musculoskeletal: Normal range of motion.  Neurological: She is alert.  Skin: Skin is warm and dry.  Psychiatric: She has a normal mood and affect.  Nursing note and vitals reviewed.   ED Course  Procedures (including critical care time) Labs Review Labs Reviewed  CBC WITH DIFFERENTIAL/PLATELET - Abnormal; Notable for the following:    RBC 3.81 (*)    Hemoglobin 11.7 (*)    HCT 35.2 (*)    All other components within  normal limits  BASIC METABOLIC PANEL - Abnormal; Notable for the following:    Glucose, Bld 117 (*)    Calcium 8.7 (*)    All other components within normal limits  WET PREP, GENITAL  POC URINE PREG, ED  GC/CHLAMYDIA PROBE AMP ([facility]) NOT AT [facility]    Imaging Review No results found. I have personally reviewed and evaluated these images and lab results as part of my medical decision-making.   EKG Interpretation None      MDM   Final diagnoses:  None   Patient presents today with a chief complaint of vaginal bleeding for the past 3-4 weeks.  Pregnancy test is negative.  Hemoglobin today is 11.7.  No significant bleeding or pain with pelvic exam.  Therefore, feel that the patient is stable for discharge.  Patient instructed to follow up with PCP and also given referral to [facility].  Stable for discharge.  Return precautions given.   Santiago Glad, PA-C 08/23/15 1155  Linwood Dibbles, MD 08/23/15 1315

## 2015-08-23 NOTE — Assessment & Plan Note (Addendum)
No calf tenderness on exam and bilateral nature of swelling makes DVT less likeley. Cr 0.81 so nephrotic syndrome less likely. No evidence of infection to cause swelling. There is concern for cardiac cause and will rule out cardiac pathology as above. - TSH at next visit as the lab was closed before this could be done today

## 2015-08-23 NOTE — Progress Notes (Signed)
Subjective:    Patient ID: Renee Lutz, female    DOB: 03-04-1976, 40 y.o.   MRN: [id number]   CC: heavy vaginal bleeding, chest pain  HPI: 40 y/o F presenting for 3 weeks of vaginal bleeding  Vaginal bleeding - started with spotting 3 weeks ago but 1 week ago became heavy with clots and necessitated tampon changes every 1-2 hours - denies lightheadedness, dizziness, SOB but does endorse chest pain with exertion over the last several months - denies vaginal irritation but was found to have BV at the ED during their evaluation earlier today - reports prolonged vaginal bleeding with menses last month else denies heavy periods - menses usually regular and last 3-4 days - has not been sexually active for at least 7 months - neg preg test in the ED  Chest pain - chest discomfort with walking up stairs or when walking up stairs - recenlty started with an personal trainer and notes chest discomfort when walking on the treadmill at an incline with him - mother had triple cardiac bypass in her mid 56s - denies tobacco use - No current chest pain  LE swelling - noted bilateral LE swelling that started 4 months ago - most on her bilateral feet, no calf tenderness - swelling does not resolve in the AM after laying prone all night  Likely OSA - reports headaches with waking and states she " knows she stops breathing at night" but has never been tested for it   Smoking status reviewed- non smoker  Review of Systems Denies recent illness, fever     Objective:  BP 140/90 mmHg  Pulse 88  Temp(Src) 98.4 F (36.9 C) (Oral)  Wt 226 lb (102.513 kg)  LMP 07/28/2015 (Approximate) Vitals and nursing note reviewed  General: NAD Cardiac: RRR, 3/6 systolic murmur Respiratory: CTAB, normal effort Abdomen: soft, nontender, nondistended, no hepatic or splenomegaly. Bowel sounds present Extremities: 1-2+ edema of bilateral feet, no calf tenderness, mild calf edema Skin: warm and dry, no  rashes noted Neuro: alert and oriented, no focal deficits  Pelvic: Normal EGBUS, normal vaginal mucosa with scant blood in the vaginal vault, normal cervix with no CMT, normal mobile uterus, normal adnexa with no masses, no adnexal tenderness     Assessment & Plan:    Essential hypertension Elevated blood pressure to 150/93 then 140/90 on recheck. Random glucose on BMP 117. Cr 0.81 - no history of DM, will start HCTZ for blood pressure and to aid in diureses given swelling - blood pressure check in 1 week  Chest pain Exertional chest pain, with new cardiac murmur, LE swelling and family history of cardiac catheterization is concerning for cardiac pathology - Echo - referral to cardiology - ED precautions reviewed  Leg swelling No calf tenderness on exam and bilateral nature of swelling makes DVT less likeley. Cr 0.81 so nephrotic syndrome less likely. No evidence of infection to cause swelling. There is concern for cardiac cause and will rule out cardiac pathology as above. - TSH at next visit as the lab was closed before this could be done today   Sleep apnea Reported apnea with sleep, with AM headaches is concerning for OSA - Will obtain sleep study  Menorrhagia Menorrhagia appears to be improving on emam. Hgb 11.7 in the ED today - will hold on treating as bleeding is improving - if she feels her bleeding worsens, will start provera 10 mg x10 days      [name] A. Keawe Marcello MD, MS Family  Medicine Resident PGY-2 Pager [phone]

## 2015-08-23 NOTE — Assessment & Plan Note (Signed)
Exertional chest pain, with new cardiac murmur, LE swelling and family history of cardiac catheterization is concerning for cardiac pathology - Echo - referral to cardiology - ED precautions reviewed

## 2015-08-23 NOTE — Assessment & Plan Note (Signed)
Menorrhagia appears to be improving on emam. Hgb 11.7 in the ED today - will hold on treating as bleeding is improving - if she feels her bleeding worsens, will start provera 10 mg x10 days

## 2015-08-23 NOTE — ED Notes (Signed)
Patient presents with c/o vaginal bleeding that has been going on for about 2 weeks.  At first it was spotting but lately it has gotten to be heavy and painful

## 2015-08-24 ENCOUNTER — Telehealth: Payer: Self-pay

## 2015-08-24 NOTE — Telephone Encounter (Signed)
LVM for pt giving her the information on her Echocardiogram appt. It is scheduled for 9:00 am on Wed 08/25/2015. Sunday SpillersSharon T Scharlene Catalina, CMA

## 2015-08-25 ENCOUNTER — Other Ambulatory Visit (HOSPITAL_COMMUNITY): Payer: BLUE CROSS/BLUE SHIELD

## 2015-09-01 ENCOUNTER — Ambulatory Visit (INDEPENDENT_AMBULATORY_CARE_PROVIDER_SITE_OTHER): Payer: BLUE CROSS/BLUE SHIELD | Admitting: *Deleted

## 2015-09-01 VITALS — BP 130/88 | HR 79

## 2015-09-01 DIAGNOSIS — Z136 Encounter for screening for cardiovascular disorders: Secondary | ICD-10-CM

## 2015-09-01 DIAGNOSIS — Z0130 Encounter for examination of blood pressure without abnormal findings: Secondary | ICD-10-CM

## 2015-09-01 DIAGNOSIS — I10 Essential (primary) hypertension: Secondary | ICD-10-CM

## 2015-09-01 NOTE — Progress Notes (Signed)
Tamika precepted patient with me. Per Tamika, she has symptoms once in a while. Could not tell the exact frequency or pattern. I recommended that patient keeps a journal of her symptoms as well as BP check. If symptoms occurs frequently or persist she is to stop medication and go to the ED. If she is also having chest pain or SOB to go to the ED. Although uncommon, HCTZ can cause Arrythmias hence recommendation for her to stop medication if this is frequent or persistent.

## 2015-09-01 NOTE — Progress Notes (Signed)
   Patient in nurse clinic for blood pressure follow up.  Patient started taking HCTZ last week.  Per patient sometimes after taking the medication she will have heart fluttering.  Precept with Dr. Lum BabeEniola; have patient complete a journal of blood pressure reading, when the heart fluttering start/stops.  Patent advised to stop the medication if the fluttering is constant, develop chest pain, SOB, dizziness, headache, n/v, visual changes or numbness/tingling of extremities. Patient also advised to go to ED if symptoms start.  Patient stated understanding.  Patient has follow up appointment on May 17 th, 2017 at 2 PM with Dr. Sampson GoonFitzgerald per patient request.   Clovis PuMartin, Tamika L, RN

## 2015-09-07 ENCOUNTER — Other Ambulatory Visit: Payer: Self-pay

## 2015-09-07 ENCOUNTER — Ambulatory Visit (HOSPITAL_COMMUNITY): Payer: BLUE CROSS/BLUE SHIELD | Attending: Cardiology

## 2015-09-07 DIAGNOSIS — I10 Essential (primary) hypertension: Secondary | ICD-10-CM | POA: Insufficient documentation

## 2015-09-07 DIAGNOSIS — I340 Nonrheumatic mitral (valve) insufficiency: Secondary | ICD-10-CM | POA: Insufficient documentation

## 2015-09-07 DIAGNOSIS — Z8249 Family history of ischemic heart disease and other diseases of the circulatory system: Secondary | ICD-10-CM | POA: Diagnosis not present

## 2015-09-07 DIAGNOSIS — R0789 Other chest pain: Secondary | ICD-10-CM | POA: Diagnosis not present

## 2015-09-07 DIAGNOSIS — R079 Chest pain, unspecified: Secondary | ICD-10-CM | POA: Diagnosis present

## 2015-09-08 ENCOUNTER — Encounter: Payer: Self-pay | Admitting: Internal Medicine

## 2015-09-08 ENCOUNTER — Ambulatory Visit (INDEPENDENT_AMBULATORY_CARE_PROVIDER_SITE_OTHER): Payer: BLUE CROSS/BLUE SHIELD | Admitting: Internal Medicine

## 2015-09-08 ENCOUNTER — Other Ambulatory Visit (HOSPITAL_COMMUNITY): Payer: BLUE CROSS/BLUE SHIELD

## 2015-09-08 VITALS — BP 167/101 | HR 74 | Temp 98.2°F | Ht 68.0 in | Wt 226.0 lb

## 2015-09-08 DIAGNOSIS — M7989 Other specified soft tissue disorders: Secondary | ICD-10-CM | POA: Diagnosis not present

## 2015-09-08 DIAGNOSIS — R030 Elevated blood-pressure reading, without diagnosis of hypertension: Secondary | ICD-10-CM | POA: Diagnosis not present

## 2015-09-08 DIAGNOSIS — I10 Essential (primary) hypertension: Secondary | ICD-10-CM

## 2015-09-08 DIAGNOSIS — IMO0001 Reserved for inherently not codable concepts without codable children: Secondary | ICD-10-CM

## 2015-09-08 LAB — D-DIMER, QUANTITATIVE (NOT AT ARMC): D-Dimer, Quant: 0.3 ug/mL-FEU (ref 0.00–0.50)

## 2015-09-08 NOTE — Progress Notes (Signed)
   Subjective:    Patient ID: Renee Lutz, female    DOB: 04-10-1976, 40 y.o.   MRN: [id number]  HPI  Renee Lutz is a 40-y/o female who presents for follow-up after starting HCTZ.   HTN: - Stopped taking HCTZ entirely 2 days ago because each time after she took it she experienced palpitations. Improved after stopping medication. - She tried cutting out caffeine prior to stopping HCTZ but still had palpitations. - Reports increased stress lately due to not knowing where her 38 year old son is.  - Complained of chest pain at last visit but denies today. Has stopped working out with a trainer, as she would have chest pain on treadmill, until she gets cleared by cardiologist at appointment scheduled for June.  - ECHO 09/07/15 showed EF of 65-70%, read as probable mild diastolic dysfunction, and trace MR was noted as well. - Will also be worked up for sleep apnea with sleep study 10/18/15.  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 - Has been flying once every few months. - Patient shared picture on her cell phone showing her left foot looking very edematous.   Review of Systems  Respiratory: Positive for shortness of breath (going up and down stairs). Negative for cough.   Cardiovascular: Positive for leg swelling. Negative for chest pain.  Neurological: Negative for syncope and light-headedness.   Social: Never smoker    Objective:   Physical Exam  Constitutional: She appears well-developed and well-nourished. No distress.  Cardiovascular: Normal rate.  Exam reveals no gallop and no friction rub.   Murmur (2/6 systolic murmur) heard. Pulmonary/Chest: Effort normal and breath sounds normal. No respiratory distress. She has no wheezes.  Musculoskeletal: She exhibits no edema or tenderness.  Negative Homan's sign bilaterally.      Assessment & Plan:  Patient presents for  follow-up of blood pressures and had to quit HCTZ due to sensation of palpitations. 6 out of 11 blood pressures over the last few years have been within target range, and patient reports being acutely stressed during today's visit.   Essential hypertension - Patient to schedule appointment with Dr. Raymondo BandKoval next week for 24-hour blood pressure monitoring. - Will decide need to treat HTN based on 24-hour reading. - Patient interested in pursuing lifestyle modification with healthier eating and increasing exercise but is awaiting cardiac clearance before restarting vigorous exercise.  Leg swelling - Will obtain D-dimer today.   Dani GobbleHillary Sholom Dulude, MD [facility], PGY-1

## 2015-09-08 NOTE — Patient Instructions (Signed)
Ms. Renee Lutz,  I will call you with the results of the blood clotting test.  Your echocardiogram showed that your heart is likely a little stiff. This can be due to high blood pressure for an extended period of time. You are safe to exercise.   Please schedule an appointment with pharmacy clinic next Thursday, 5/25, preferably around 8:30 a.m. To get the 24-hour blood pressure cuff. Dr. Raymondo BandKoval is the provider you will see. He would then like to see you Friday morning, 5/26, to review blood pressures.  Best, Dr. Sampson GoonFitzgerald

## 2015-09-11 NOTE — Assessment & Plan Note (Signed)
-   Will obtain D-dimer today.

## 2015-09-11 NOTE — Assessment & Plan Note (Addendum)
-   Patient to schedule appointment with Dr. Raymondo BandKoval next week for 24-hour blood pressure monitoring. - Will decide need to treat HTN based on 24-hour reading. - Patient interested in pursuing lifestyle modification with healthier eating and increasing exercise but is awaiting cardiac clearance before restarting vigorous exercise.

## 2015-09-16 ENCOUNTER — Encounter: Payer: Self-pay | Admitting: Pharmacist

## 2015-09-16 ENCOUNTER — Ambulatory Visit (INDEPENDENT_AMBULATORY_CARE_PROVIDER_SITE_OTHER): Payer: BLUE CROSS/BLUE SHIELD | Admitting: Pharmacist

## 2015-09-16 VITALS — BP 131/87 | HR 73 | Wt 221.2 lb

## 2015-09-16 DIAGNOSIS — I10 Essential (primary) hypertension: Secondary | ICD-10-CM | POA: Diagnosis not present

## 2015-09-16 NOTE — Progress Notes (Signed)
Patient ID: Renee ShelterBetty Burgeson, female   DOB: May 24, 1975, 40 y.o.   MRN: [id number] Reviewed: Agree with Dr. Macky LowerKoval's documentation and management.

## 2015-09-16 NOTE — Progress Notes (Signed)
Patient arrives in good spirits ambulating on her own.    Presents to the clinic for hypertension evaluation. Patient was referred on 09/08/15 by Dr. Sampson GoonFitzgerald.  Patient was last seen by Primary Care Provider on 09/08/15.   Given history of Vaginal Bleeding, additional input from Dr. Jennette KettleNeal was obtained.   Patient reports adherence with medications.  Current BP Medications include: none  Antihypertensives tried in the past include: HCTZ (started 08/23/15) - patient stopped 5/15 because it caused heart palpitations at rest.   Last 3 Office BP readings: BP Readings from Last 3 Encounters:  09/16/15 131/87  09/08/15 167/101  09/01/15 130/88   BMET    Component Value Date/Time   NA 140 08/23/2015 0231   K 3.5 08/23/2015 0231   CL 106 08/23/2015 0231   CO2 25 08/23/2015 0231   GLUCOSE 117* 08/23/2015 0231   BUN 12 08/23/2015 0231   CREATININE 0.81 08/23/2015 0231   CREATININE 0.76 10/31/2013 1208   CALCIUM 8.7* 08/23/2015 0231   GFRNONAA >60 08/23/2015 0231   GFRAA >60 08/23/2015 0231    A/P: Hypertension newly diagnosed, currently borderline for treatment. [facility] for excessive vaginal bleeding. Follow-up with PCP and was started on HCTZ for hypertension. Patient stopped HCTZ about 2 weeks later. She stated that about 4 hours after taking the medication that her heart would feel like it was pounding out of her chest. Of note, her most recent BMET revealed a K of 3.5.   Results reviewed and written information provided.   Total time in face-to-face counseling 30 minutes.  F/U Clinic Visit with Dr. Sampson GoonFitzgerald in 1 month.  Patient seen with Leone HavenAubrey Jones, PharmD Resident.

## 2015-09-16 NOTE — Assessment & Plan Note (Signed)
Hypertension newly diagnosed, currently borderline for treatment. [facility] for excessive vaginal bleeding. Follow-up with PCP and was started on HCTZ for hypertension. Patient stopped HCTZ about 2 weeks later. She stated that about 4 hours after taking the medication that her heart would feel like it was pounding out of her chest. Of note, her most recent BMET revealed a K of 3.5.

## 2015-09-16 NOTE — Patient Instructions (Addendum)
Thank you for coming in today! It was very nice to meet you.  For pain control you can take up to four 200mg  Ibuprofens every 6 hours as needed.  Make sure that you drink plenty of water and try to take this with a snack to prevent upset stomach.  Follow up with Dr. Sampson GoonFitzgerald in 1 month.

## 2015-09-22 ENCOUNTER — Ambulatory Visit (INDEPENDENT_AMBULATORY_CARE_PROVIDER_SITE_OTHER): Payer: BLUE CROSS/BLUE SHIELD | Admitting: Obstetrics & Gynecology

## 2015-09-22 ENCOUNTER — Encounter: Payer: Self-pay | Admitting: Obstetrics & Gynecology

## 2015-09-22 ENCOUNTER — Other Ambulatory Visit: Payer: Self-pay | Admitting: Obstetrics & Gynecology

## 2015-09-22 VITALS — BP 133/79 | HR 72 | Wt 221.9 lb

## 2015-09-22 DIAGNOSIS — L680 Hirsutism: Secondary | ICD-10-CM

## 2015-09-22 DIAGNOSIS — N939 Abnormal uterine and vaginal bleeding, unspecified: Secondary | ICD-10-CM

## 2015-09-22 DIAGNOSIS — Z124 Encounter for screening for malignant neoplasm of cervix: Secondary | ICD-10-CM

## 2015-09-22 LAB — TSH: TSH: 0.75 mIU/L

## 2015-09-22 LAB — CBC
HCT: 32.5 % — ABNORMAL LOW (ref 35.0–45.0)
Hemoglobin: 10.5 g/dL — ABNORMAL LOW (ref 11.7–15.5)
MCH: 29.2 pg (ref 27.0–33.0)
MCHC: 32.3 g/dL (ref 32.0–36.0)
MCV: 90.3 fL (ref 80.0–100.0)
MPV: 10.1 fL (ref 7.5–12.5)
Platelets: 377 10*3/uL (ref 140–400)
RBC: 3.6 MIL/uL — ABNORMAL LOW (ref 3.80–5.10)
RDW: 13.3 % (ref 11.0–15.0)
WBC: 5.8 10*3/uL (ref 3.8–10.8)

## 2015-09-22 MED ORDER — SPIRONOLACTONE 100 MG PO TABS: 100.0000 mg | ORAL_TABLET | Freq: Every day | ORAL | Status: DC

## 2015-09-22 MED ORDER — NORETHINDRONE ACET-ETHINYL EST 1-20 MG-MCG PO TABS: 1.0000 | ORAL_TABLET | Freq: Every day | ORAL | Status: DC

## 2015-09-22 NOTE — Patient Instructions (Signed)
Levonorgestrel intrauterine device (IUD) What is this medicine? LEVONORGESTREL IUD (LEE voe nor jes trel) is a contraceptive (birth control) device. The device is placed inside the uterus by a healthcare professional. It is used to prevent pregnancy and can also be used to treat heavy bleeding that occurs during your period. Depending on the device, it can be used for 3 to 5 years. This medicine may be used for other purposes; ask your health care provider or pharmacist if you have questions. What should I tell my health care provider before I take this medicine? They need to know if you have any of these conditions: -abnormal Pap smear -cancer of the breast, uterus, or cervix -diabetes -endometritis -genital or pelvic infection now or in the past -have more than one sexual partner or your partner has more than one partner -heart disease -history of an ectopic or tubal pregnancy -immune system problems -IUD in place -liver disease or tumor -problems with blood clots or take blood-thinners -use intravenous drugs -uterus of unusual shape -vaginal bleeding that has not been explained -an unusual or allergic reaction to levonorgestrel, other hormones, silicone, or polyethylene, medicines, foods, dyes, or preservatives -pregnant or trying to get pregnant -breast-feeding How should I use this medicine? This device is placed inside the uterus by a health care professional. Talk to your pediatrician regarding the use of this medicine in children. Special care may be needed. Overdosage: If you think you have taken too much of this medicine contact a poison control center or emergency room at once. NOTE: This medicine is only for you. Do not share this medicine with others. What if I miss a dose? This does not apply. What may interact with this medicine? Do not take this medicine with any of the following medications: -amprenavir -bosentan -fosamprenavir This medicine may also interact with  the following medications: -aprepitant -barbiturate medicines for inducing sleep or treating seizures -bexarotene -griseofulvin -medicines to treat seizures like carbamazepine, ethotoin, felbamate, oxcarbazepine, phenytoin, topiramate -modafinil -pioglitazone -rifabutin -rifampin -rifapentine -some medicines to treat HIV infection like atazanavir, indinavir, lopinavir, nelfinavir, tipranavir, ritonavir -St. John's wort -warfarin This list may not describe all possible interactions. Give your health care provider a list of all the medicines, herbs, non-prescription drugs, or dietary supplements you use. Also tell them if you smoke, drink alcohol, or use illegal drugs. Some items may interact with your medicine. What should I watch for while using this medicine? Visit your doctor or health care professional for regular check ups. See your doctor if you or your partner has sexual contact with others, becomes HIV positive, or gets a sexual transmitted disease. This product does not protect you against HIV infection (AIDS) or other sexually transmitted diseases. You can check the placement of the IUD yourself by reaching up to the top of your vagina with clean fingers to feel the threads. Do not pull on the threads. It is a good habit to check placement after each menstrual period. Call your doctor right away if you feel more of the IUD than just the threads or if you cannot feel the threads at all. The IUD may come out by itself. You may become pregnant if the device comes out. If you notice that the IUD has come out use a backup birth control method like condoms and call your health care provider. Using tampons will not change the position of the IUD and are okay to use during your period. What side effects may I notice from receiving this medicine?   Side effects that you should report to your doctor or health care professional as soon as possible: -allergic reactions like skin rash, itching or  hives, swelling of the face, lips, or tongue -fever, flu-like symptoms -genital sores -high blood pressure -no menstrual period for 6 weeks during use -pain, swelling, warmth in the leg -pelvic pain or tenderness -severe or sudden headache -signs of pregnancy -stomach cramping -sudden shortness of breath -trouble with balance, talking, or walking -unusual vaginal bleeding, discharge -yellowing of the eyes or skin Side effects that usually do not require medical attention (report to your doctor or health care professional if they continue or are bothersome): -acne -breast pain -change in sex drive or performance -changes in weight -cramping, dizziness, or faintness while the device is being inserted -headache -irregular menstrual bleeding within first 3 to 6 months of use -nausea This list may not describe all possible side effects. Call your doctor for medical advice about side effects. You may report side effects to FDA at 1-800-FDA-1088. Where should I keep my medicine? This does not apply. NOTE: This sheet is a summary. It may not cover all possible information. If you have questions about this medicine, talk to your doctor, pharmacist, or health care provider.    2016, Elsevier/Gold Standard. (2011-05-11 13:54:04)  

## 2015-09-22 NOTE — Progress Notes (Signed)
Patient ID: Renee Lutz, female   DOB: [birth year], 40 y.o.   MRN: [id number] History:  40 y.o. LNMP 5/21-27/2017  [id number]. here today for eval of AUB. Pt with irreg cycles starting in late Mar early April thru May 5th.  Pt reproted pain at the same time.  Usual cycles 7-9 days which is normal for her.  2-3 days heavy then very light.  In April the bleeding was very heavy and prolonged.  Pt has 'normal' cranps with her cycles but had pain this time with the heavy bleeding and clots.    Pt also c/o excessive hair growth. She shaves her face and chin, abd, chest and pubic hair.  She feels that the hair growth is getting worse.  Menarch at age 40 years.    The following portions of the patient's history were reviewed and updated as appropriate: allergies, current medications, past family history, past medical history, past social history, past surgical history and problem list.  Review of Systems:  Pertinent items are noted in HPI.  Objective:  Physical Exam Blood pressure 133/79, pulse 72, weight 221 lb 14.4 oz (100.653 kg), last menstrual period 08/27/2015. Gen: NAD HEENT: female pattern facial hair stubble pattern with side burns.   Abd: Soft, nontender and nondistended; obese Pelvic: Normal appearing external genitalia; no significant clitoromegaly noted;  normal appearing vaginal mucosa and cervix.  Normal discharge.  Small uterus, no other palpable masses, no uterine or adnexal tenderness  CBC    Component Value Date/Time   WBC 7.1 08/23/2015 0231   RBC 3.81* 08/23/2015 0231   HGB 11.7* 08/23/2015 0231   HCT 35.2* 08/23/2015 0231   PLT 288 08/23/2015 0231   MCV 92.4 08/23/2015 0231   MCH 30.7 08/23/2015 0231   MCHC 33.2 08/23/2015 0231   RDW 12.6 08/23/2015 0231   LYMPHSABS 2.6 08/23/2015 0231   MONOABS 0.4 08/23/2015 0231   EOSABS 0.1 08/23/2015 0231   BASOSABS 0.0 08/23/2015 0231    Labs and Imaging 11/20/2013 Diagnosis 1. Cervix, biopsy - LOW GRADE SQUAMOUS INTRAEPITHELIAL  LESION, CIN-I (MILD DYSPLASIA). 2. Endocervix, curettage - DETACHED FRAGMENTS OF DYSPLASTIC SQUAMOUS EPITHELIUM, CONSISTENT WITH LOW GRADE SQUAMOUS INTRAEPITHELIAL LESION, CIN-I (MILD DYSPLASIA). - BENIGN ENDOCERVICAL GLANDS  Assessment & Plan:  AUB Hirsutism- pt counseled about the benefits of OCPs and spironolactone for tx.  She was counseled that the results are slow and may not to show significant results for 6 months or more.   Spironolactone 100mg  po q day Loestrin 1/20 n1 po q day- no contraindications to OCPs  F/u PAP Labs: DHEAS, TSH, Testerone, FSH, LH,  SHBG  Pelvic sono F/u in 3 months or sooner prn  Janara Klett L. Harraway-Smith, M.D., [facility]

## 2015-09-23 ENCOUNTER — Encounter: Payer: Self-pay | Admitting: Obstetrics & Gynecology

## 2015-09-23 LAB — TESTOSTERONE, FREE AND TOTAL (INCLUDES SHBG)-(MALES)
SEX HORMONE BINDING: 26 nmol/L (ref 17–124)
TESTOSTERONE FREE: 14.7 pg/mL — AB (ref 0.6–6.8)
TESTOSTERONE-% FREE: 2.1 % (ref 0.4–2.4)
Testosterone: 71 ng/dL

## 2015-09-23 LAB — FOLLICLE STIMULATING HORMONE: FSH: 7 m[IU]/mL

## 2015-09-23 LAB — DHEA-SULFATE: DHEA-SO4: 192 ug/dL (ref 23–266)

## 2015-09-23 LAB — LUTEINIZING HORMONE: LH: 3.8 m[IU]/mL

## 2015-09-27 LAB — CYTOLOGY - PAP

## 2015-09-29 ENCOUNTER — Telehealth: Payer: Self-pay | Admitting: General Practice

## 2015-09-29 NOTE — Telephone Encounter (Signed)
Per Dr Erin FullingHarraway Smith, patient's pap was abnormal and needs a colpo. Her labs will be reviewed when she comes for the colpo. Called patient, no answer- left message stating we are trying to reach you with results, please call us back at the clinics

## 2015-09-29 NOTE — Telephone Encounter (Signed)
Patient called back into front office. Informed patient of results & explained colposcopy to patient. Patient verbalized understanding & states she can only come on Wednesday afternoons between 2-4 and she cannot come before July 12. Told patient we will likely have to have her see a different provider given her schedule. Told patient someone will review her lab results then. Patient verbalized understanding to all & had no questions

## 2015-09-30 ENCOUNTER — Ambulatory Visit (HOSPITAL_COMMUNITY): Payer: BLUE CROSS/BLUE SHIELD

## 2015-10-06 ENCOUNTER — Ambulatory Visit (INDEPENDENT_AMBULATORY_CARE_PROVIDER_SITE_OTHER): Payer: BLUE CROSS/BLUE SHIELD | Admitting: Internal Medicine

## 2015-10-06 ENCOUNTER — Encounter: Payer: Self-pay | Admitting: Internal Medicine

## 2015-10-06 VITALS — BP 136/93 | HR 77 | Ht 68.0 in | Wt 218.2 lb

## 2015-10-06 DIAGNOSIS — R0600 Dyspnea, unspecified: Secondary | ICD-10-CM | POA: Insufficient documentation

## 2015-10-06 DIAGNOSIS — I10 Essential (primary) hypertension: Secondary | ICD-10-CM | POA: Diagnosis not present

## 2015-10-06 DIAGNOSIS — R079 Chest pain, unspecified: Secondary | ICD-10-CM | POA: Diagnosis not present

## 2015-10-06 NOTE — Progress Notes (Signed)
OFFICE NOTE  Chief Complaint:  Chest pain  Primary Care Physician: Janit PaganENIOLA, KEHINDE, MD  HPI:  Renee Lutz is a 40 y.o. female who was referred to me for evaluation of chest pain. [name] reports that recently she was doing some exercise with a personal trainer on a treadmill and had some chest pain which resolved with rest. Since then she's decreased her exercise significantly. She is also recently been having problems with dysfunctional uterine bleeding. She's been placed on Loestrin as well as Aldactone. She's noted to have some hirsutism features. From a coronary disease standpoint, her sister has hypertension and her mother had 3 vessel bypass in her 7350s who also has diabetes and hypertension. She had a lipid profile which showed a favorable cholesterol and LDL in the 60s last year. She is not on medication. Recently she was noted to have increasing daytime somnolence and is been told by some friends that she snores and stops breathing at night. She was scheduled for sleep study which is coming up in the end of June. EKG shows normal sinus rhythm at 77 without ischemic changes.  PMHx:  Past Medical History  Diagnosis Date  . Hirsutism     Past Surgical History  Procedure Laterality Date  . Tubal ligation  2002    FAMHx:  Family History  Problem Relation Age of Onset  . Breast cancer Mother   . Diabetes Mother   . Hypertension Mother   . Fibroids Mother   . Hypertension Father   . Glaucoma Father     SOCHx:   reports that she has never smoked. She has never used smokeless tobacco. She reports that she drinks alcohol. She reports that she does not use illicit drugs.  ALLERGIES:  Allergies  Allergen Reactions  . Hctz [Hydrochlorothiazide] Palpitations    Palpitations.    ROS: Pertinent items noted in HPI and remainder of comprehensive ROS otherwise negative.  HOME MEDS: Current Outpatient Prescriptions  Medication Sig Dispense Refill  . ibuprofen  (ADVIL,MOTRIN) 600 MG tablet Take 1 tablet (600 mg total) by mouth every 8 (eight) hours as needed. 30 tablet 0  . norethindrone-ethinyl estradiol (LOESTRIN 1/20, 21,) 1-20 MG-MCG tablet Take 1 tablet by mouth daily. 1 Package 11  . spironolactone (ALDACTONE) 100 MG tablet Take 1 tablet (100 mg total) by mouth daily. 30 tablet 6   No current facility-administered medications for this visit.    LABS/IMAGING: No results found for this or any previous visit (from the past 48 hour(s)). No results found.  WEIGHTS: Wt Readings from Last 3 Encounters:  10/06/15 218 lb 3.2 oz (98.975 kg)  09/22/15 221 lb 14.4 oz (100.653 kg)  09/16/15 221 lb 3.2 oz (100.336 kg)    VITALS: BP 136/93 mmHg  Pulse 77  Ht 5\' 8"  (1.727 m)  Wt 218 lb 3.2 oz (98.975 kg)  BMI 33.18 kg/m2  LMP 08/27/2015  EXAM: General appearance: alert and no distress Neck: no carotid bruit and no JVD Lungs: clear to auscultation bilaterally Heart: regular rate and rhythm, S1, S2 normal, no murmur, click, rub or gallop Abdomen: soft, non-tender; bowel sounds normal; no masses,  no organomegaly Extremities: extremities normal, atraumatic, no cyanosis or edema Pulses: 2+ and symmetric Skin: Skin color, texture, turgor normal. No rashes or lesions Neurologic: Grossly normal Psych: Pleasant  EKG: Normal sinus rhythm at 77  ASSESSMENT: 1. Exertional chest pain 2. Possible hypertension 3. Family history of coronary disease 4. Possible obstructive sleep apnea  PLAN: 1.  [name] has been having some exertional chest discomfort which was noted on treadmill exercise. She's backed off from that but is also reported some shortness of breath, particularly when walking up stairs. Although she has few personal coronary cardiac risk factors, there is a strong family history of coronary disease in her mother who had triple bypass in her 62s. I like for her to undergo an exercise treadmill stress test. If abnormal she may need  further workup. I agree with a referral for sleep study which is scheduled in the end of June. Some of her symptoms may be related to sleep apnea. Will recheck her blood pressure when she returns that she may need to go on medication. She was previously on HCTZ but had side effects and was taken off of that medicine.  Thanks again for the kind referral.  Chrystie Nose, MD, [facility] Attending Cardiologist [facility]  Chrystie Nose 10/06/2015, 3:31 PM

## 2015-10-06 NOTE — Patient Instructions (Signed)
Your physician has requested that you have an exercise tolerance test. For further information please visit [email or web address] Please also follow instruction sheet, as given.  Your physician recommends that you schedule a follow-up appointment w/Dr. Rennis Golden after your test  Exercise Stress Electrocardiogram An exercise stress electrocardiogram is a test that is done to evaluate the blood supply to your heart. This test may also be called exercise stress electrocardiography. The test is done while you are walking on a treadmill. The goal of this test is to raise your heart rate. This test is done to find areas of poor blood flow to the heart by determining the extent of coronary artery disease (CAD).   CAD is defined as narrowing in one or more heart (coronary) arteries of more than 70%. If you have an abnormal test result, this may mean that you are not getting adequate blood flow to your heart during exercise. Additional testing may be needed to understand why your test was abnormal. LET [facility] CARE PROVIDER KNOW ABOUT:   Any allergies you have.  All medicines you are taking, including vitamins, herbs, eye drops, creams, and over-the-counter medicines.  Previous problems you or members of your family have had with the use of anesthetics.  Any blood disorders you have.  Previous surgeries you have had.  Medical conditions you have.  Possibility of pregnancy, if this applies. RISKS AND COMPLICATIONS Generally, this is a safe procedure. However, as with any procedure, complications can occur. Possible complications can include:  Pain or pressure in the following areas:  Chest.  Jaw or neck.  Between your shoulder blades.  Radiating down your left arm.  Dizziness or light-headedness.  Shortness of breath.  Increased or irregular heartbeats.  Nausea or vomiting.  Heart attack (rare). BEFORE THE PROCEDURE  Avoid all forms of caffeine 24 hours before your test or as  directed by your health care provider. This includes coffee, tea (even decaffeinated tea), caffeinated sodas, chocolate, cocoa, and certain pain medicines.  Follow your health care provider's instructions regarding eating and drinking before the test.  Take your medicines as directed at regular times with water unless instructed otherwise. Exceptions may include:  If you have diabetes, ask how you are to take your insulin or pills. It is common to adjust insulin dosing the morning of the test.  If you are taking beta-blocker medicines, it is important to talk to your health care provider about these medicines well before the date of your test. Taking beta-blocker medicines may interfere with the test. In some cases, these medicines need to be changed or stopped 24 hours or more before the test.  If you wear a nitroglycerin patch, it may need to be removed prior to the test. Ask your health care provider if the patch should be removed before the test.  If you use an inhaler for any breathing condition, bring it with you to the test.  If you are an outpatient, bring a snack so you can eat right after the stress phase of the test.  Do not smoke for 4 hours prior to the test or as directed by your health care provider.  Do not apply lotions, powders, creams, or oils on your chest prior to the test.  Wear loose-fitting clothes and comfortable shoes for the test. This test involves walking on a treadmill. PROCEDURE  Multiple patches (electrodes) will be put on your chest. If needed, small areas of your chest may have to be shaved to get better contact  with the electrodes. Once the electrodes are attached to your body, multiple wires will be attached to the electrodes and your heart rate will be monitored.  Your heart will be monitored both at rest and while exercising.  You will walk on a treadmill. The treadmill will be started at a slow pace. The treadmill speed and incline will gradually be  increased to raise your heart rate. AFTER THE PROCEDURE  Your heart rate and blood pressure will be monitored after the test.  You may return to your normal schedule including diet, activities, and medicines, unless your health care provider tells you otherwise.   This information is not intended to replace advice given to you by your health care provider. Make sure you discuss any questions you have with your health care provider.   Document Released: 04/07/2000 Document Revised: 04/15/2013 Document Reviewed: 12/16/2012 Elsevier Interactive Patient Education [employer]2016 Elsevier Inc.

## 2015-10-07 ENCOUNTER — Other Ambulatory Visit (HOSPITAL_COMMUNITY): Payer: BLUE CROSS/BLUE SHIELD

## 2015-10-13 ENCOUNTER — Ambulatory Visit (HOSPITAL_COMMUNITY)
Admission: RE | Admit: 2015-10-13 | Discharge: 2015-10-13 | Disposition: A | Discharge status: Home / Self Care | Payer: BLUE CROSS/BLUE SHIELD | Source: Ambulatory Visit | Attending: Obstetrics & Gynecology | Admitting: Obstetrics & Gynecology

## 2015-10-13 DIAGNOSIS — N939 Abnormal uterine and vaginal bleeding, unspecified: Secondary | ICD-10-CM | POA: Insufficient documentation

## 2015-10-13 DIAGNOSIS — R1909 Other intra-abdominal and pelvic swelling, mass and lump: Secondary | ICD-10-CM | POA: Insufficient documentation

## 2015-10-13 DIAGNOSIS — R935 Abnormal findings on diagnostic imaging of other abdominal regions, including retroperitoneum: Secondary | ICD-10-CM | POA: Insufficient documentation

## 2015-10-18 ENCOUNTER — Ambulatory Visit (HOSPITAL_BASED_OUTPATIENT_CLINIC_OR_DEPARTMENT_OTHER): Payer: BLUE CROSS/BLUE SHIELD | Attending: Family Medicine | Admitting: Internal Medicine

## 2015-10-18 ENCOUNTER — Telehealth: Payer: Self-pay

## 2015-10-18 VITALS — Ht 68.0 in | Wt 217.0 lb

## 2015-10-18 DIAGNOSIS — R51 Headache: Secondary | ICD-10-CM | POA: Insufficient documentation

## 2015-10-18 DIAGNOSIS — R0683 Snoring: Secondary | ICD-10-CM | POA: Diagnosis not present

## 2015-10-18 DIAGNOSIS — R0789 Other chest pain: Secondary | ICD-10-CM

## 2015-10-18 DIAGNOSIS — Z6833 Body mass index (BMI) 33.0-33.9, adult: Secondary | ICD-10-CM | POA: Insufficient documentation

## 2015-10-18 DIAGNOSIS — E669 Obesity, unspecified: Secondary | ICD-10-CM | POA: Insufficient documentation

## 2015-10-18 DIAGNOSIS — G4733 Obstructive sleep apnea (adult) (pediatric): Secondary | ICD-10-CM | POA: Insufficient documentation

## 2015-10-18 DIAGNOSIS — G4719 Other hypersomnia: Secondary | ICD-10-CM | POA: Insufficient documentation

## 2015-10-18 DIAGNOSIS — R5383 Other fatigue: Secondary | ICD-10-CM | POA: Diagnosis not present

## 2015-10-18 NOTE — Telephone Encounter (Signed)
Called patient no answer. Per [facility]- The sono was WNL. There were no concerning labs. Will review on her next visit in 2 1/2 months. Please assess bleeding on OCP's.

## 2015-10-18 NOTE — Progress Notes (Signed)
Called patient no answer or voicemail to leave a message. Please call pt. The sono was WNL. There were no concerning labs. Will review on her next visit in 2 1/2 months. Please assess bleeding on OCP's.

## 2015-10-18 NOTE — Telephone Encounter (Signed)
error 

## 2015-10-20 NOTE — Telephone Encounter (Signed)
Pt has been informed of lab results and she plans to follow up in two months after she takes her second dose of BC pills.

## 2015-10-24 DIAGNOSIS — R0789 Other chest pain: Secondary | ICD-10-CM | POA: Diagnosis not present

## 2015-10-24 DIAGNOSIS — G4733 Obstructive sleep apnea (adult) (pediatric): Secondary | ICD-10-CM | POA: Diagnosis not present

## 2015-10-24 NOTE — Procedures (Signed)
   Patient Name: Ellis SavageBynum, Tanikka Study Date: 10/18/2015 Gender: Female D.O.B: 1976-01-19 Age (years): 40 Referring Provider: Payton MccallumJeffrey Walden Height (inches): 68 Interpreting Physician: Jetty Duhamellinton Young MD, ABSM Weight (lbs): 217 RPSGT: Celene KrasCharles, Nicole BMI: 33 MRN: [id number] Neck Size: 14.50 CLINICAL INFORMATION Sleep Study Type: NPSG Indication for sleep study: Excessive Daytime Sleepiness, Fatigue, Morning Headaches, Obesity, Snoring Epworth Sleepiness Score: 11  SLEEP STUDY TECHNIQUE As per the AASM Manual for the Scoring of Sleep and Associated Events v2.3 (April 2016) with a hypopnea requiring 4% desaturations. The channels recorded and monitored were frontal, central and occipital EEG, electrooculogram (EOG), submentalis EMG (chin), nasal and oral airflow, thoracic and abdominal wall motion, anterior tibialis EMG, snore microphone, electrocardiogram, and pulse oximetry.  MEDICATIONS Patient's medications include: charted for review. Medications self-administered by patient during sleep study : No sleep medicine administered.  SLEEP ARCHITECTURE The study was initiated at 10:43:54 PM and ended at 4:44:32 AM. Sleep onset time was 7.1 minutes and the sleep efficiency was 95.4%. The total sleep time was 344.0 minutes. Stage REM latency was 82.0 minutes. The patient spent 1.31% of the night in stage N1 sleep, 74.86% in stage N2 sleep, 9.45% in stage N3 and 14.39% in REM. Alpha intrusion was absent. Supine sleep was 80.53%.  RESPIRATORY PARAMETERS The overall apnea/hypopnea index (AHI) was 8.9 per hour. There were 25 total apneas, including 25 obstructive, 0 central and 0 mixed apneas. There were 26 hypopneas and 19 RERAs. The AHI during Stage REM sleep was 32.7 per hour. AHI while supine was 11.0 per hour. The mean oxygen saturation was 95.87%. The minimum SpO2 during sleep was 87.00%. Loud snoring was noted during this study.  CARDIAC DATA The 2 lead EKG demonstrated sinus  rhythm. The mean heart rate was 76.49 beats per minute. Other EKG findings include: None.  LEG MOVEMENT DATA The total PLMS were 388 with a resulting PLMS index of 67.67. Associated arousal with leg movement index was 3.0 .  IMPRESSIONS - Mild obstructive sleep apnea occurred during this study (AHI = 8.9/h). - There were not enough early events to meet protocol requirements for split CPAP titration. - No significant central sleep apnea occurred during this study (CAI = 0.0/h). - Mild oxygen desaturation was noted during this study (Min O2 = 87.00%). - The patient snored with Loud snoring volume. - No cardiac abnormalities were noted during this study. - Severe periodic limb movements of sleep occurred during the study. No significant associated arousals.  DIAGNOSIS - Obstructive Sleep Apnea (327.23 [G47.33 ICD-10])  RECOMMENDATIONS - If conservative measures are insufficient, then CPAP titration, a dental oral appliance or chin strap might be appropriate. - Positional therapy avoiding supine position during sleep. - Avoid alcohol, sedatives and other CNS depressants that may worsen sleep apnea and disrupt normal sleep architecture. - Sleep hygiene should be reviewed to assess factors that may improve sleep quality. - Weight management and regular exercise should be initiated or continued if appropriate.   Waymon BudgeYOUNG,CLINTON D Diplomate, American Board of Sleep Medicine  ELECTRONICALLY SIGNED ON:  10/24/2015, 9:42 AM [facility] PH: [phone]   FX: [phone] ACCREDITED BY THE AMERICAN ACADEMY OF SLEEP MEDICINE

## 2015-10-25 ENCOUNTER — Telehealth: Payer: Self-pay | Admitting: Family Medicine

## 2015-10-25 NOTE — Telephone Encounter (Signed)
Patient returned my call. I discussed sleep study report with her and also reminded her of her upcoming appointment with Dr. Jimmey RalphParker. She also confirmed she is already seeing a gynecologist for her vaginal bleeding. She will continue management with Gyn. Patient advised to call or see us soon if having any questions or concern. She verbalized understanding.

## 2015-10-25 NOTE — Telephone Encounter (Signed)
Message left to call back.  Note: Sleep study shows mild OSA requiring conservative measures. She has an appointment set up with Dr. Jimmey RalphParker on 10/26/25 to further discussed this. Per Dr. Sinclair ShipYoungs telephone note, he will also discuss result with patient in 2.5 months at follow up.  There was mention of vaginal bleeding on OCP on Dr. Sinclair ShipYoungs note. I reviewed her record, it seems she is under the care of a Gynecology for this. Dr. Jimmey RalphParker to address this at her visit on 10/26/25.  Note forwarded to him.

## 2015-10-27 ENCOUNTER — Encounter: Payer: Self-pay | Admitting: Family Medicine

## 2015-10-27 ENCOUNTER — Ambulatory Visit (INDEPENDENT_AMBULATORY_CARE_PROVIDER_SITE_OTHER): Payer: BLUE CROSS/BLUE SHIELD | Admitting: Family Medicine

## 2015-10-27 VITALS — BP 121/75 | HR 75 | Temp 98.5°F | Wt 213.0 lb

## 2015-10-27 DIAGNOSIS — G4730 Sleep apnea, unspecified: Secondary | ICD-10-CM | POA: Diagnosis not present

## 2015-10-27 NOTE — Progress Notes (Signed)
    Subjective:  Renee Lutz is a 40 y.o. female who [facility] with a chief complaint of OSA.   HPI:  OSA Patient with longstanding history of snoring at night. People have also told her that she occasionally stops breathing during sleep. Patient also has excessive daytime somnolence and frequently takes naps during the day.  Had a sleep study performed 2 weeks ago that showed mild OSA without need for CPAP. Recommended conservative interventions.  Patient has been trying to lose weight via exercise and healthier diet. Denies alcohol use. Patient unsure if symptoms are positional.    ROS: Per HPI  Objective:  Physical Exam: BP 121/75 mmHg  Pulse 75  Temp(Src) 98.5 F (36.9 C) (Oral)  Wt 213 lb (96.616 kg)  LMP 10/18/2015  Gen: NAD, resting comfortably HEENT: Mallampati 1. O/P clear CV: RRR with no murmurs appreciated Pulm: NWOB, CTAB with no crackles, wheezes, or rhonchi  Assessment/Plan:  Sleep apnea Sleep study revealed mild OSA without clear indication for CPAP. Recommended conservative management. Discussed this with patient including weight loss, avoidance of alcohol, good sleep hygiene habits, and trying different positions during sleep. Patient receptive to these recommendations. Return precautions reviewed. Instructed patient to return to care if symptoms not improving with these interventions. May need CPAP in the future if symptoms not improving with conservative intervention.     Katina Degreealeb M. Jimmey RalphParker, MD [facility] Resident PGY-3 10/27/2015 2:36 PM

## 2015-10-27 NOTE — Assessment & Plan Note (Signed)
Sleep study revealed mild OSA without clear indication for CPAP. Recommended conservative management. Discussed this with patient including weight loss, avoidance of alcohol, good sleep hygiene habits, and trying different positions during sleep. Patient receptive to these recommendations. Return precautions reviewed. Instructed patient to return to care if symptoms not improving with these interventions. May need CPAP in the future if symptoms not improving with conservative intervention.

## 2015-10-27 NOTE — Patient Instructions (Signed)
Your sleep study showed only mild sleep apnea. You do not need a CPAP machine. Please try the things we talked about today to improve your sleep apnea:  1) Continue exercising and eating healthy. Losing weight is the most important thing you can do your for sleep apnea and overall health.  2) Avoid alcohol.  3) Avoid caffeine in the afternoon. Please try to do relaxing activites an hour before bed. Do not look at any sort of screen about an hour before bed.  4) Use your bed only for sleep  5) you can try different sleep positions to see if it helps, for example you can try using a body pillow to help with sleeping on your side.  If your symptoms are not improving, please let us know.  Take care,  Dr Jimmey RalphParker

## 2015-11-05 ENCOUNTER — Telehealth (HOSPITAL_COMMUNITY): Payer: Self-pay

## 2015-11-05 NOTE — Telephone Encounter (Signed)
Encounter complete. 

## 2015-11-10 ENCOUNTER — Inpatient Hospital Stay (HOSPITAL_COMMUNITY): Admission: RE | Admit: 2015-11-10 | Payer: BLUE CROSS/BLUE SHIELD | Source: Ambulatory Visit

## 2015-12-01 ENCOUNTER — Ambulatory Visit: Payer: BLUE CROSS/BLUE SHIELD | Admitting: Internal Medicine

## 2016-02-17 IMAGING — CR DG CHEST 2V
2 series · 2 of 2 positions shown · non-contrast
Comparison: 01/18/2006

CLINICAL DATA: Shortness of breath, chest pain, pain and tingling
down both arms for 2 weeks

EXAM:
CHEST  2 VIEW

[w chest pa]
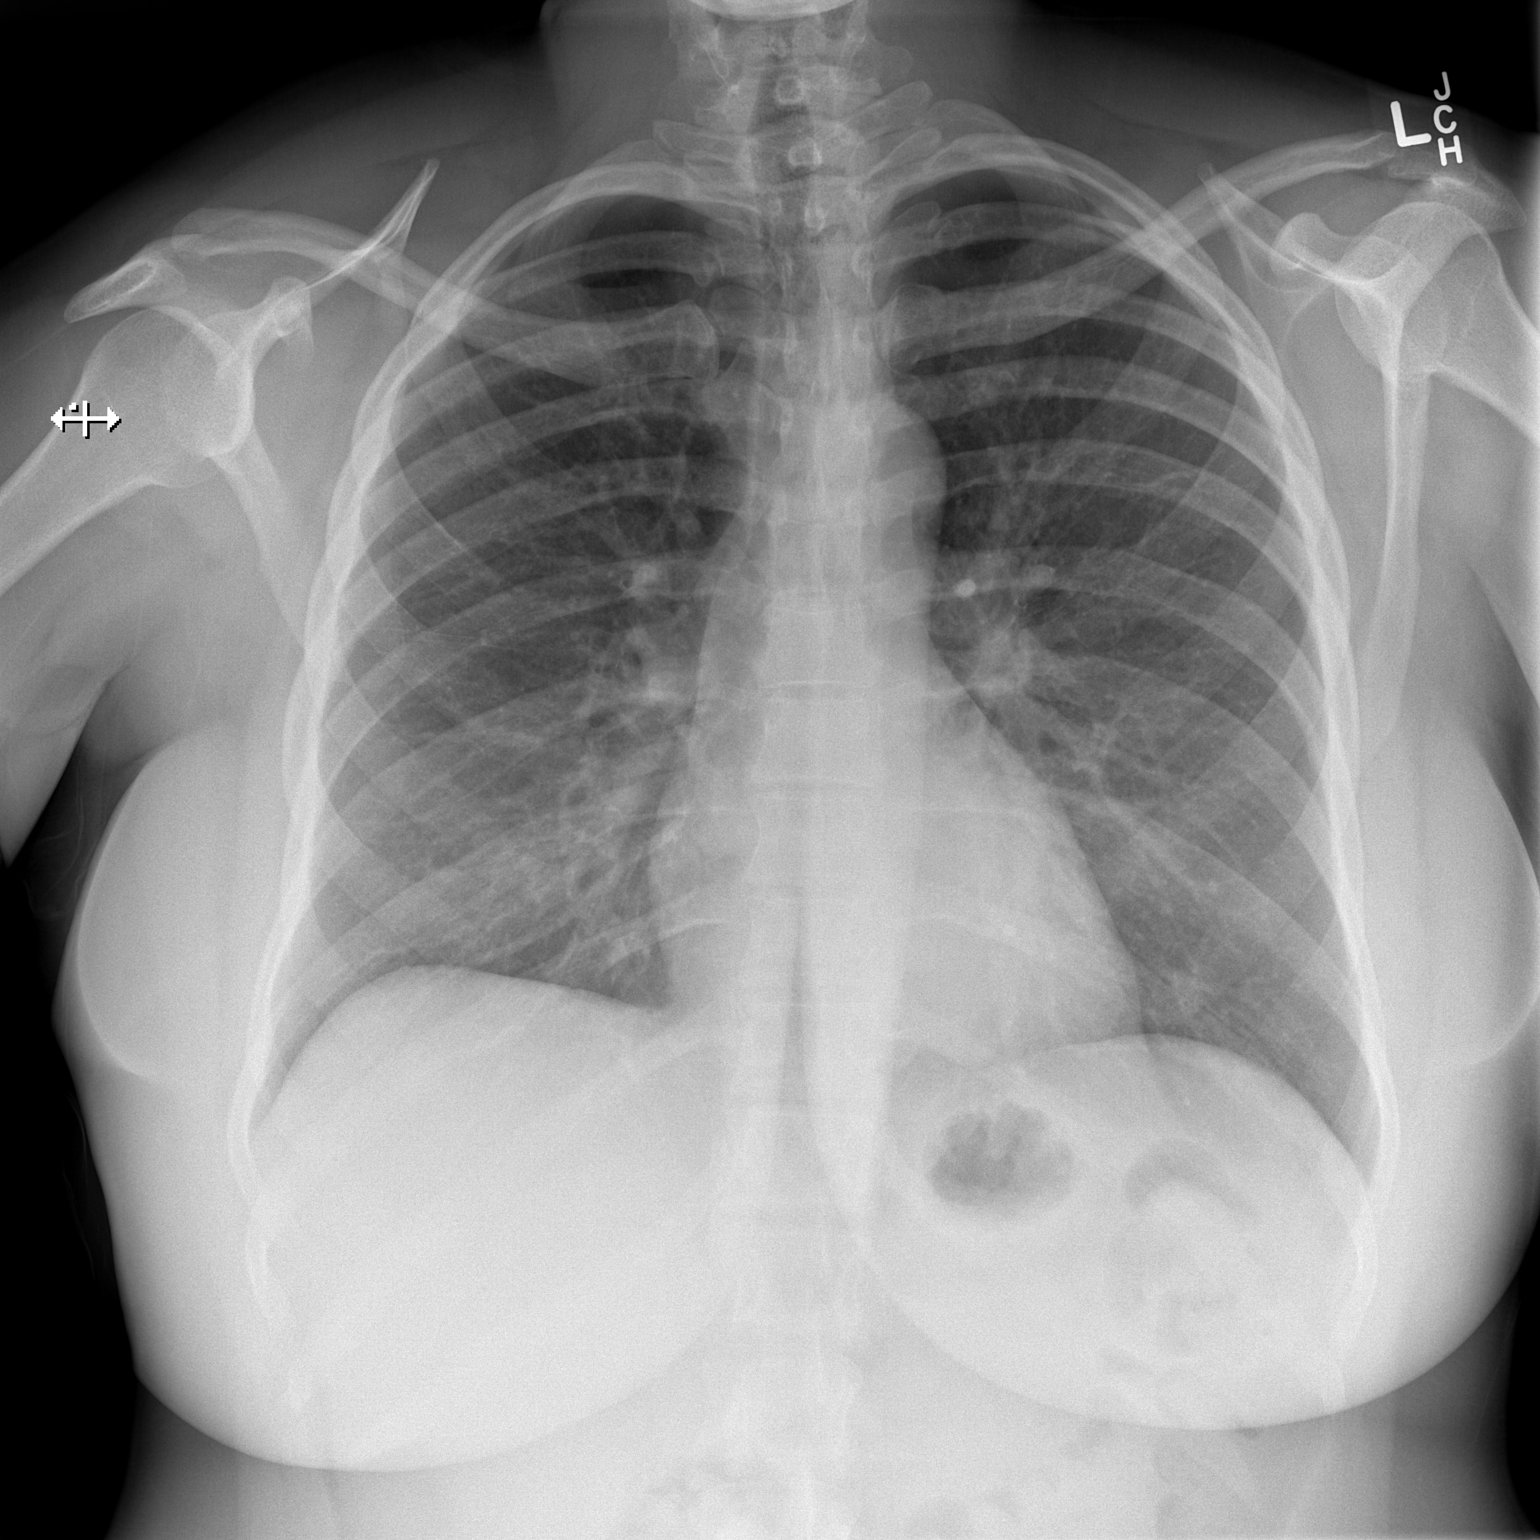

[w chest lat]
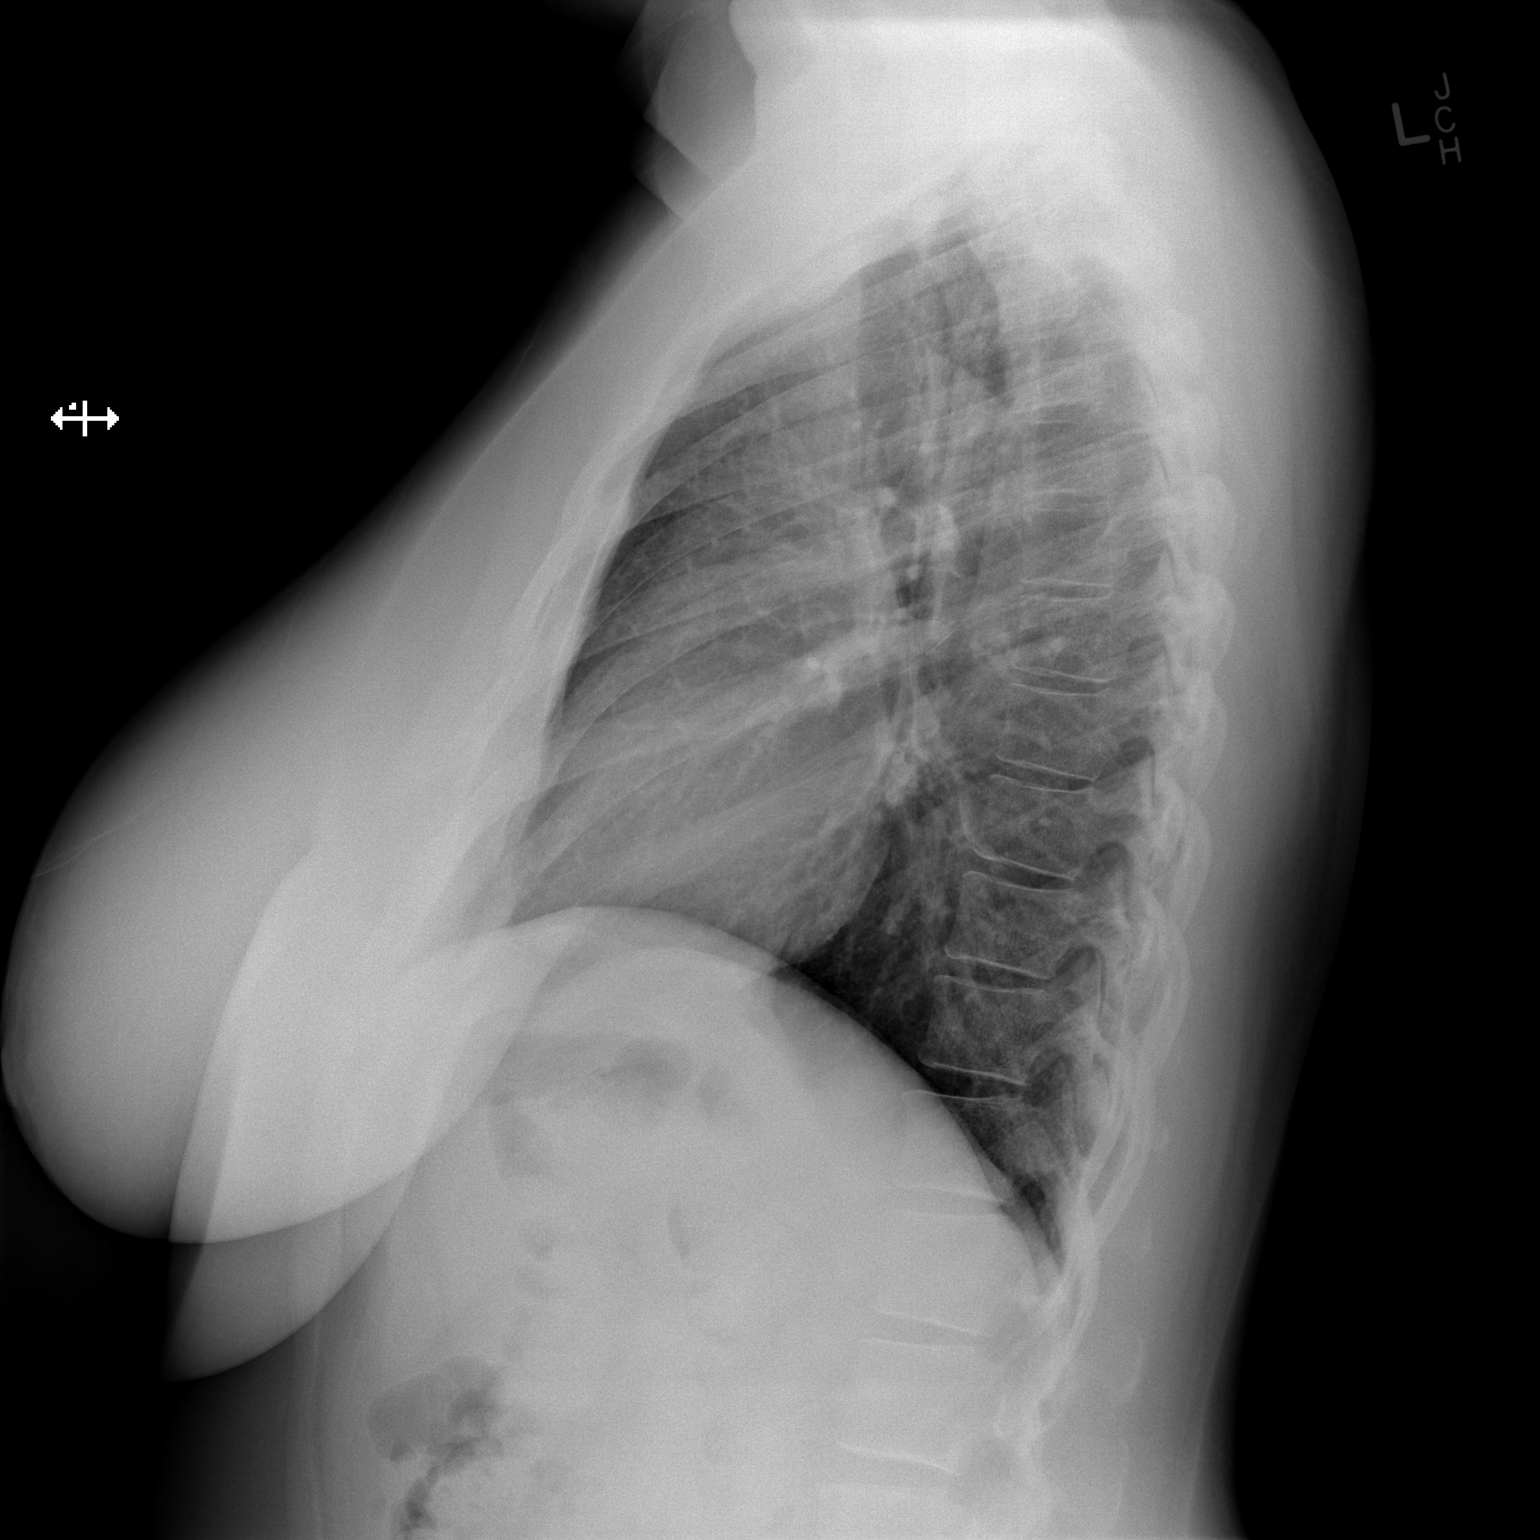

[2 of 2 positions shown; findings below may reference images not displayed]

FINDINGS: Normal heart size, mediastinal contours, and pulmonary vascularity.

Lungs clear.

No pleural effusion or pneumothorax.

Question mild pectus excavatum.
IMPRESSION: No acute abnormalities.

## 2017-05-10 ENCOUNTER — Encounter: Payer: Self-pay | Admitting: Student

## 2017-05-10 ENCOUNTER — Ambulatory Visit (INDEPENDENT_AMBULATORY_CARE_PROVIDER_SITE_OTHER): Payer: Self-pay | Admitting: Student

## 2017-05-10 ENCOUNTER — Other Ambulatory Visit: Payer: Self-pay

## 2017-05-10 VITALS — BP 138/92 | HR 111 | Temp 99.1°F | Ht 68.5 in | Wt 223.2 lb

## 2017-05-10 DIAGNOSIS — J111 Influenza due to unidentified influenza virus with other respiratory manifestations: Secondary | ICD-10-CM

## 2017-05-10 DIAGNOSIS — R69 Illness, unspecified: Secondary | ICD-10-CM

## 2017-05-10 NOTE — Patient Instructions (Signed)
It appears that you have a viral upper respiratory infection (Common Cold).  You could have flu.  Unfortunately, it is too late to start treatment for flu. Cold symptoms typically peak at 3-4 days of illness and then gradually improve over 10-14 days. However, a cough may last 3-5 weeks.   - A tablespoonful of honey before bedtime is helpful for cough - Get plenty of rest and adequate hydration. - Consume warm fluids (soup or tea). It relieves stuffy nose, and to loosen phlegm. - Can try saline nasal spray or a Neti Pot for stuffy nose  CONTACT YOUR DOCTOR IF YOU EXPERIENCE ANY OF THE FOLLOWING: - High fever, chest pain, shortness of breath or  not able to keep down food or fluids.  - Cough that gets worse while other cold symptoms improve - Flare up of any chronic lung problem, such as asthma - Your symptoms persist longer than 2 weeks

## 2017-05-10 NOTE — Progress Notes (Signed)
  Subjective:    Renee Lutz is a 42 y.o. old female here for cough  HPI Cough: for 4 days. Cough is productive with yellowish phlegm. Some blood tinge occasionally. Sore throat for 4 days. Denies chest pain, shortness of breath and fever. Didn't check her temperature. Woke up soaked in sweat yesterday morning. Denies GI symptoms. No skin rash. Daughter with sore throat. Son with vomiting. Hasn't had flu shot this year. Had myalgia yesterday. No headache.  She is here because she needed work note for her work. No history of asthma or COPD. Denies smoking cigarettes.   PMH/Problem List: has MENSTRUAL CYCLE, IRREGULAR; CERVICAL MUSCLE STRAIN; LACERATION, FINGER; Screening for malignant neoplasm of the cervix; Well adult exam; Contact with or exposure to other viral diseases(V01.79); Contact with or exposure to venereal diseases; BV (bacterial vaginosis); Acute pharyngitis; Essential hypertension; Chest pain; Leg swelling; Sleep apnea; Menorrhagia; Essential hypertension, benign; and Dyspnea on their problem list.   has a past medical history of Hirsutism.  FH:  Family History  Problem Relation Age of Onset  . Breast cancer Mother   . Diabetes Mother   . Hypertension Mother   . Fibroids Mother   . Hypertension Father   . Glaucoma Father     SH Social History   Tobacco Use  . Smoking status: Never Smoker  . Smokeless tobacco: Never Used  Substance Use Topics  . Alcohol use: Yes    Comment: ocassionally  . Drug use: No    Review of Systems Review of systems negative except for pertinent positives and negatives in history of present illness above.     Objective:     Vitals:   05/10/17 1430  BP: (!) 138/92  Pulse: (!) 111  Temp: 99.1 F (37.3 C)  TempSrc: Oral  SpO2: 94%  Weight: 223 lb 3.2 oz (101.2 kg)  Height: 5' 8.5" (1.74 m)   Body mass index is 33.44 kg/m.  Physical Exam  GEN: appears well, no apparent distress. Head: normocephalic and atraumatic  Eyes:  conjunctiva without injection, sclera anicteric Ears: external ear and ear canal normal Nares: Some rhinorrhea but no congestion Oropharynx: mmm without erythema or exudation HEM: negative for cervical or periauricular lymphadenopathies CVS: RRR, nl s1 & s2, no murmurs, no edema RESP: no IWOB, good air movement bilaterally, CTAB GI: BS present & normal, soft, NTND SKIN: no apparent skin lesion NEURO: alert and oiented appropriately, no gross deficits  PSYCH: euthymic mood with congruent affect    Assessment and Plan:  1. Influenza-like illness: Patient with URI symptoms, fever, headache and myalgia suggestive for influenza.  She did not have influenza vaccine this year.  Likely, his symptoms have improved.  She is outside the window for Tamiflu.  She has 0 out of 5 on Cent[employer]'s criteria to think of strep pharyngitis. She has some temperature and tachycardia. Cardiopulmonary exam within normal limits.  -Recommended conservative management including rest and adequate hydration -Discussed return precautions including but not limited to shortness of breath or increased working of breathing, severe persistent cough, persistent fever over 101F, not tolerating fluids by mouth or other symptoms concerning to her  Return if symptoms worsen or fail to improve.  Almon Herculesaye T Maysin Carstens, MD 05/10/17 Pager: [phone]

## 2017-05-21 ENCOUNTER — Other Ambulatory Visit: Payer: Self-pay

## 2017-05-21 ENCOUNTER — Emergency Department (HOSPITAL_COMMUNITY): Payer: BLUE CROSS/BLUE SHIELD

## 2017-05-21 ENCOUNTER — Emergency Department (HOSPITAL_COMMUNITY)
Admission: EM | Admit: 2017-05-21 | Discharge: 2017-05-21 | Disposition: A | Discharge status: Home / Self Care | Payer: BLUE CROSS/BLUE SHIELD | Attending: Emergency Medicine | Admitting: Emergency Medicine

## 2017-05-21 ENCOUNTER — Encounter (HOSPITAL_COMMUNITY): Payer: Self-pay | Admitting: Emergency Medicine

## 2017-05-21 DIAGNOSIS — I10 Essential (primary) hypertension: Secondary | ICD-10-CM | POA: Diagnosis not present

## 2017-05-21 DIAGNOSIS — B349 Viral infection, unspecified: Secondary | ICD-10-CM | POA: Diagnosis not present

## 2017-05-21 DIAGNOSIS — R300 Dysuria: Secondary | ICD-10-CM | POA: Insufficient documentation

## 2017-05-21 DIAGNOSIS — R509 Fever, unspecified: Secondary | ICD-10-CM | POA: Diagnosis present

## 2017-05-21 LAB — CBC WITH DIFFERENTIAL/PLATELET
BASOS PCT: 0 %
Basophils Absolute: 0 10*3/uL (ref 0.0–0.1)
Eosinophils Absolute: 0 10*3/uL (ref 0.0–0.7)
Eosinophils Relative: 1 %
HEMATOCRIT: 34.8 % — AB (ref 36.0–46.0)
HEMOGLOBIN: 11.6 g/dL — AB (ref 12.0–15.0)
Lymphocytes Relative: 31 %
Lymphs Abs: 1.8 10*3/uL (ref 0.7–4.0)
MCH: 30 pg (ref 26.0–34.0)
MCHC: 33.3 g/dL (ref 30.0–36.0)
MCV: 89.9 fL (ref 78.0–100.0)
MONOS PCT: 10 %
Monocytes Absolute: 0.6 10*3/uL (ref 0.1–1.0)
NEUTROS ABS: 3.4 10*3/uL (ref 1.7–7.7)
NEUTROS PCT: 58 %
Platelets: 273 10*3/uL (ref 150–400)
RBC: 3.87 MIL/uL (ref 3.87–5.11)
RDW: 13.2 % (ref 11.5–15.5)
WBC: 5.9 10*3/uL (ref 4.0–10.5)

## 2017-05-21 LAB — URINALYSIS, ROUTINE W REFLEX MICROSCOPIC
BILIRUBIN URINE: NEGATIVE
Glucose, UA: NEGATIVE mg/dL
KETONES UR: NEGATIVE mg/dL
Leukocytes, UA: NEGATIVE
Nitrite: NEGATIVE
PH: 6 (ref 5.0–8.0)
Protein, ur: NEGATIVE mg/dL
SPECIFIC GRAVITY, URINE: 1.014 (ref 1.005–1.030)

## 2017-05-21 LAB — BASIC METABOLIC PANEL
Anion gap: 8 (ref 5–15)
BUN: 9 mg/dL (ref 6–20)
CHLORIDE: 104 mmol/L (ref 101–111)
CO2: 25 mmol/L (ref 22–32)
Calcium: 8.3 mg/dL — ABNORMAL LOW (ref 8.9–10.3)
Creatinine, Ser: 0.89 mg/dL (ref 0.44–1.00)
GFR calc non Af Amer: >60 mL/min (ref >60)
Glucose, Bld: 94 mg/dL (ref 65–99)
POTASSIUM: 3.4 mmol/L — AB (ref 3.5–5.1)
SODIUM: 137 mmol/L (ref 135–145)

## 2017-05-21 MED ORDER — ACETAMINOPHEN 325 MG PO TABS: 650.0000 mg | ORAL_TABLET | Freq: Four times a day (QID) | ORAL | Status: DC | PRN

## 2017-05-21 MED ORDER — IBUPROFEN 600 MG PO TABS: 600.0000 mg | ORAL_TABLET | Freq: Four times a day (QID) | ORAL | 0 refills | Status: AC | PRN

## 2017-05-21 MED ORDER — BENZONATATE 100 MG PO CAPS: 100.0000 mg | ORAL_CAPSULE | Freq: Three times a day (TID) | ORAL | 0 refills | Status: AC

## 2017-05-21 NOTE — ED Notes (Signed)
After being discharged, Pt called [name] RN into room and reported she just received a call from her significant other stating she needs to be tested for STDs.  EDP gave verbal order for GC/Chlamydia urine.

## 2017-05-21 NOTE — ED Provider Notes (Signed)
[facility]-EMERGENCY DEPT Provider Note   CSN: [id number] Arrival date & time: 05/21/17  0608     History   Chief Complaint Chief Complaint  Patient presents with  . Fever  . Dysuria    HPI Renee Lutz is a 42 y.o. female.  The history is provided by the patient. No language interpreter was used.  Fever   This is a new problem. The current episode started yesterday. The problem occurs constantly. The problem has not changed since onset.The maximum temperature noted was 101 to 101.9 F. Associated symptoms include cough. She has tried nothing for the symptoms. The treatment provided no relief.  Dysuria    Pt reports increased frequency.  Pt reports she coughs and feels like she needs to urinate.  Pt reports she only urinates a small amount when she goes.    Past Medical History:  Diagnosis Date  . Hirsutism     Patient Active Problem List   Diagnosis Date Noted  . Essential hypertension, benign 10/06/2015  . Dyspnea 10/06/2015  . Essential hypertension 08/23/2015  . Chest pain 08/23/2015  . Leg swelling 08/23/2015  . Sleep apnea 08/23/2015  . Menorrhagia 08/23/2015  . Acute pharyngitis 10/31/2013  . BV (bacterial vaginosis) 06/08/2010  . Screening for malignant neoplasm of the cervix 06/01/2010  . Well adult exam 06/01/2010  . Contact with or exposure to other viral diseases(V01.79) 06/01/2010  . Contact with or exposure to venereal diseases 06/01/2010  . CERVICAL MUSCLE STRAIN 08/24/2008  . LACERATION, FINGER 03/26/2008  . MENSTRUAL CYCLE, IRREGULAR 06/21/2006    Past Surgical History:  Procedure Laterality Date  . TUBAL LIGATION  2002    OB History    No data available       Home Medications    Prior to Admission medications   Medication Sig Start Date End Date Taking? Authorizing Provider  guaiFENesin (ROBITUSSIN) 100 MG/5ML liquid Take 200 mg by mouth 3 (three) times daily as needed for cough.   Yes [provider]    Phenylephrine-DM-GG-APAP (MUCINEX FAST-MAX COLD FLU) 5-10-200-325 MG/10ML LIQD Take 10 mLs by mouth every 8 (eight) hours as needed (congestion).   Yes [provider]  benzonatate (TESSALON) 100 MG capsule Take 1 capsule (100 mg total) by mouth every 8 (eight) hours. 05/21/17   Elson AreasSofia, Rickie Gutierres K, PA-C  ibuprofen (ADVIL,MOTRIN) 600 MG tablet Take 1 tablet (600 mg total) by mouth every 6 (six) hours as needed. 05/21/17   Elson AreasSofia, Shamia Uppal K, PA-C    Family History Family History  Problem Relation Age of Onset  . Breast cancer Mother   . Diabetes Mother   . Hypertension Mother   . Fibroids Mother   . Hypertension Father   . Glaucoma Father     Social History Social History   Tobacco Use  . Smoking status: Never Smoker  . Smokeless tobacco: Never Used  Substance Use Topics  . Alcohol use: Yes    Comment: ocassionally  . Drug use: No     Allergies   Hctz [hydrochlorothiazide]   Review of Systems Review of Systems  Constitutional: Positive for fever.  Respiratory: Positive for cough.   Genitourinary: Positive for dysuria.  All other systems reviewed and are negative.    Physical Exam Updated Vital Signs BP 131/76 (BP Location: Left Arm)   Pulse 96   Temp 98.5 F (36.9 C) (Oral)   Resp 20   Ht 5\' 8"  (1.727 m)   Wt 101.2 kg (223 lb)  LMP 03/24/2017   SpO2 96%   BMI 33.91 kg/m   Physical Exam  Constitutional: She appears well-developed and well-nourished. No distress.  HENT:  Head: Normocephalic and atraumatic.  Right Ear: External ear normal.  Left Ear: External ear normal.  Nose: Nose normal.  Mouth/Throat: Oropharynx is clear and moist.  Eyes: Conjunctivae are normal.  Neck: Neck supple.  Cardiovascular: Normal rate and regular rhythm.  No murmur heard. Pulmonary/Chest: Effort normal and breath sounds normal. No respiratory distress.  Abdominal: Soft. There is no tenderness.  Musculoskeletal: She exhibits no edema.  Neurological: She is alert.   Skin: Skin is warm and dry.  Psychiatric: She has a normal mood and affect.  Nursing note and vitals reviewed.    ED Treatments / Results  Labs (all labs ordered are listed, but only abnormal results are displayed) Labs Reviewed  URINALYSIS, ROUTINE W REFLEX MICROSCOPIC - Abnormal; Notable for the following components:      Result Value   Hgb urine dipstick SMALL (*)    Bacteria, UA RARE (*)    Squamous Epithelial / LPF 0-5 (*)    All other components within normal limits  CBC WITH DIFFERENTIAL/PLATELET - Abnormal; Notable for the following components:   Hemoglobin 11.6 (*)    HCT 34.8 (*)    All other components within normal limits  BASIC METABOLIC PANEL - Abnormal; Notable for the following components:   Potassium 3.4 (*)    Calcium 8.3 (*)    All other components within normal limits  GC/CHLAMYDIA PROBE AMP ([place]) NOT AT [facility]    EKG  EKG Interpretation None       Radiology Dg Chest 2 View  Result Date: 05/21/2017 CLINICAL DATA:  Fever, myalgias, dysuria EXAM: CHEST  2 VIEW COMPARISON:  08/17/2013 FINDINGS: The heart size and mediastinal contours are within normal limits. Both lungs are clear. The visualized skeletal structures are unremarkable. IMPRESSION: No active cardiopulmonary disease. Electronically Signed   By: Judie Petit.  Shick M.D.   On: 05/21/2017 07:58    Procedures Procedures (including critical care time)  Medications Ordered in ED Medications  acetaminophen (TYLENOL) tablet 650 mg (not administered)     Initial Impression / Assessment and Plan / ED Course  I have reviewed the triage vital signs and the nursing notes.  Pertinent labs & imaging results that were available during my care of the patient were reviewed by me and considered in my medical decision making (see chart for details).     MDM pt counseled on xray result.  Chest xray is normal, urine shows no evidence of infection.   I suspect viral uri.   After discharge pt informed RN  that she wanted STD testing.  Partner advised her she may have been exposed.  GC and Ct added. Pt did not want to return for pelvic exam.  Final Clinical Impressions(s) / ED Diagnoses   Final diagnoses:  Viral illness    ED Discharge Orders        Ordered    benzonatate (TESSALON) 100 MG capsule  Every 8 hours     05/21/17 1017    ibuprofen (ADVIL,MOTRIN) 600 MG tablet  Every 6 hours PRN     05/21/17 1017     An After Visit Summary was printed and given to the patient.   Osie Cheeks 05/21/17 1341    Lorre Nick, MD 05/21/17 [phone]

## 2017-05-21 NOTE — ED Triage Notes (Signed)
Pt reports having fever, body aches, and chills for the last week and dysuria since yesterday. Pt denies taking any medication recently.

## 2017-05-21 NOTE — Discharge Instructions (Signed)
See your Physician for recheck in 2-3 days  

## 2017-05-21 NOTE — ED Notes (Signed)
Patient transported to X-ray 

## 2017-05-22 LAB — GC/CHLAMYDIA PROBE AMP (~~LOC~~) NOT AT ARMC
CHLAMYDIA, DNA PROBE: NEGATIVE
NEISSERIA GONORRHEA: NEGATIVE

## 2019-11-21 IMAGING — CR DG CHEST 2V
2 series · 2 of 2 positions shown · non-contrast
Comparison: 08/17/2013

CLINICAL DATA: Fever, myalgias, dysuria

EXAM:
CHEST  2 VIEW

[w chest pa]
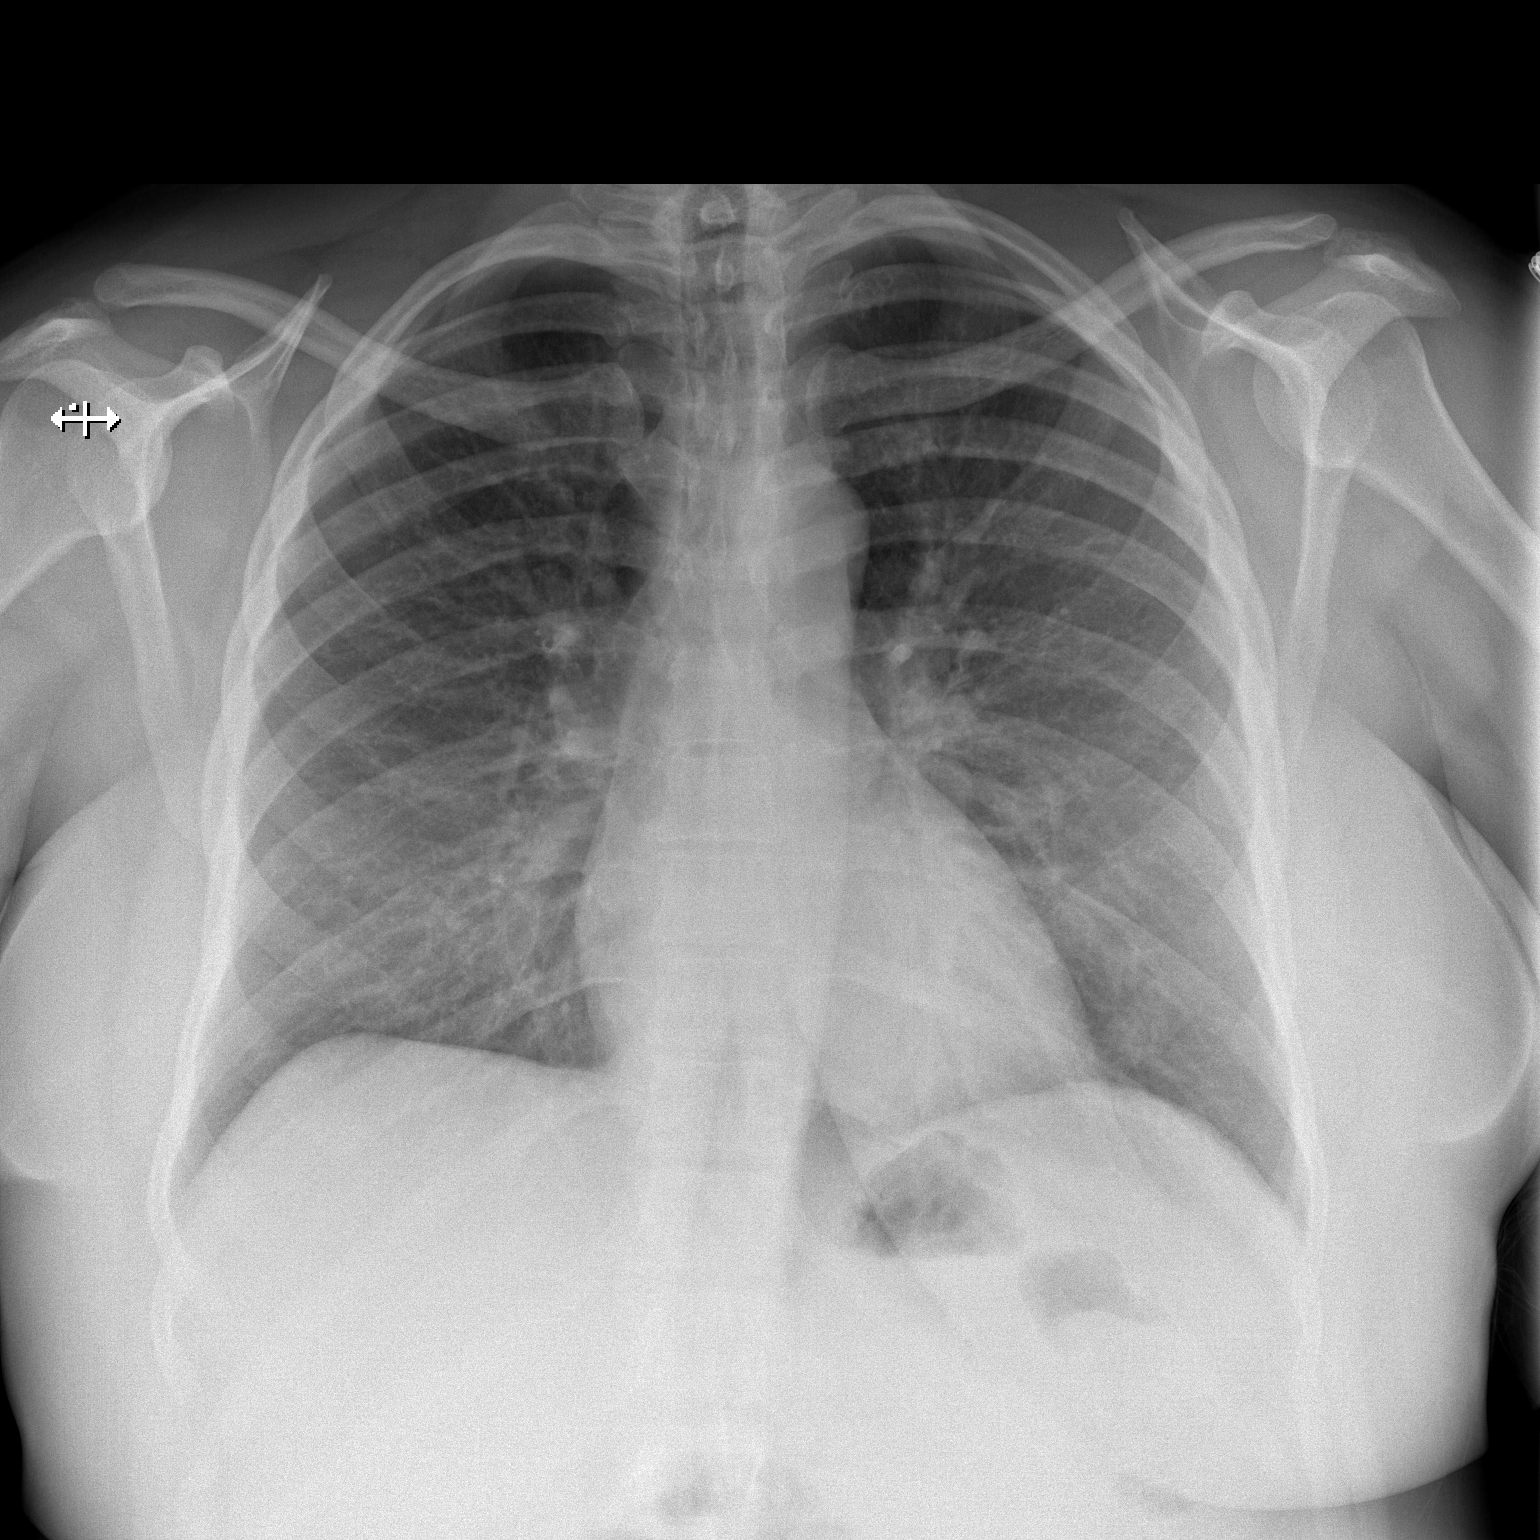

[w chest lat]
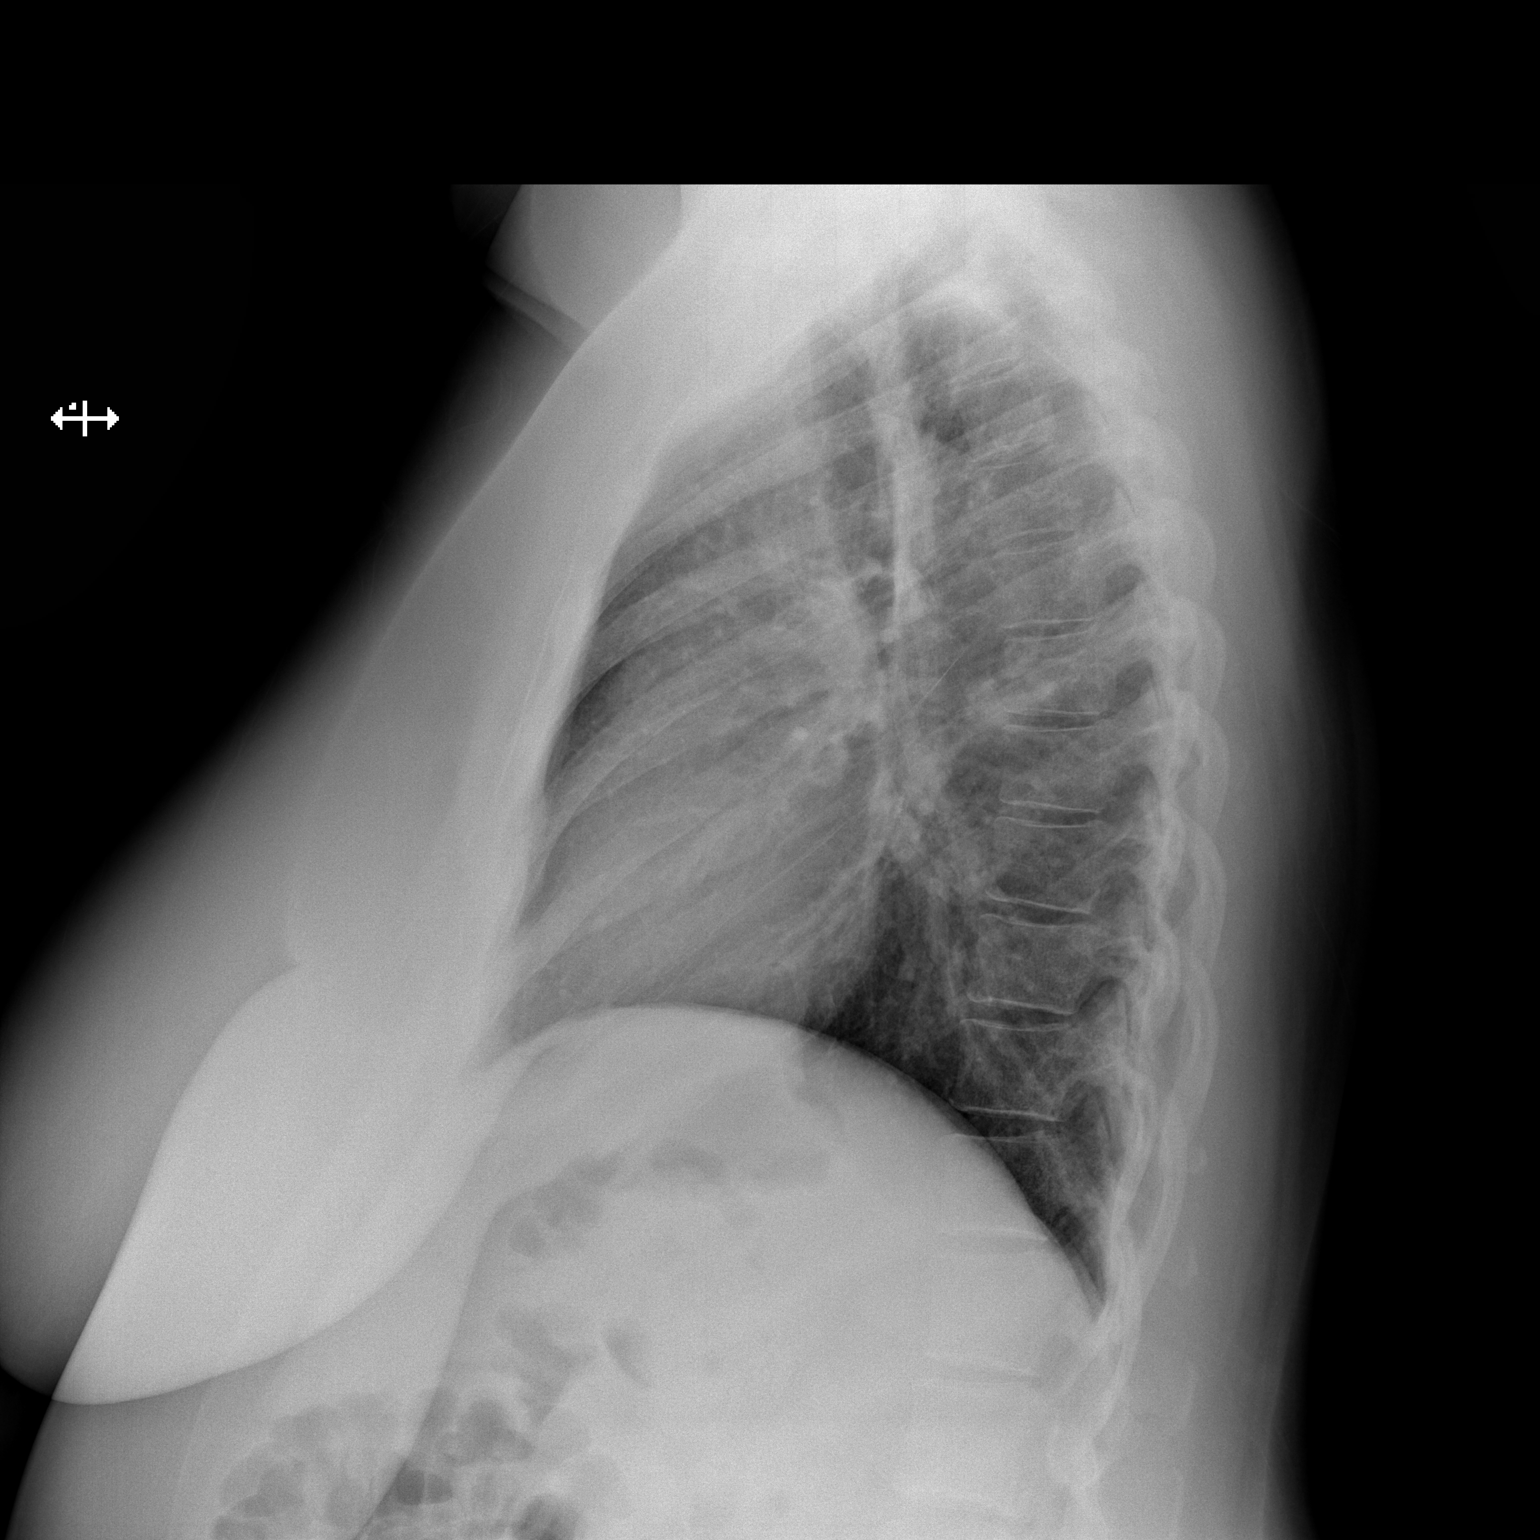

[2 of 2 positions shown; findings below may reference images not displayed]

FINDINGS: The heart size and mediastinal contours are within normal limits.
Both lungs are clear. The visualized skeletal structures are
unremarkable.
IMPRESSION: No active cardiopulmonary disease.

## 2020-03-22 ENCOUNTER — Encounter: Payer: Self-pay | Admitting: Family Medicine
